# Patient Record
Sex: Male | Born: 1980 | Race: Black or African American | Hispanic: No | Marital: Single | Smoking: Former smoker
Health system: Southern US, Community
[De-identification: ages and names within clinical notes are randomized; demographics above are authoritative.]

## PROBLEM LIST (undated history)

## (undated) DIAGNOSIS — M109 Gout, unspecified: Secondary | ICD-10-CM

## (undated) DIAGNOSIS — I10 Essential (primary) hypertension: Secondary | ICD-10-CM

## (undated) HISTORY — DX: Morbid (severe) obesity due to excess calories: E66.01

## (undated) HISTORY — DX: Essential (primary) hypertension: I10

---

## 2012-03-22 ENCOUNTER — Encounter (HOSPITAL_COMMUNITY): Payer: Self-pay | Admitting: Radiology

## 2012-03-22 ENCOUNTER — Emergency Department (HOSPITAL_COMMUNITY)
Admission: EM | Admit: 2012-03-22 | Discharge: 2012-03-22 | Disposition: A | Discharge status: Home / Self Care | Payer: No Typology Code available for payment source | Attending: Emergency Medicine | Admitting: Emergency Medicine

## 2012-03-22 ENCOUNTER — Emergency Department (HOSPITAL_COMMUNITY): Payer: No Typology Code available for payment source

## 2012-03-22 DIAGNOSIS — F17200 Nicotine dependence, unspecified, uncomplicated: Secondary | ICD-10-CM | POA: Insufficient documentation

## 2012-03-22 DIAGNOSIS — S060X0A Concussion without loss of consciousness, initial encounter: Secondary | ICD-10-CM | POA: Insufficient documentation

## 2012-03-22 DIAGNOSIS — Y9389 Activity, other specified: Secondary | ICD-10-CM | POA: Insufficient documentation

## 2012-03-22 DIAGNOSIS — S0990XA Unspecified injury of head, initial encounter: Secondary | ICD-10-CM | POA: Insufficient documentation

## 2012-03-22 DIAGNOSIS — S060X9A Concussion with loss of consciousness of unspecified duration, initial encounter: Secondary | ICD-10-CM

## 2012-03-22 MED ORDER — HYDROCODONE-ACETAMINOPHEN 5-325 MG PO TABS
1.0000 | ORAL_TABLET | ORAL | Status: DC | PRN
Start: 2012-03-22 — End: 2012-06-25

## 2012-03-22 MED ORDER — HYDROCODONE-ACETAMINOPHEN 5-325 MG PO TABS: 1.0000 | ORAL_TABLET | Freq: Once | ORAL | Status: DC

## 2012-03-22 MED ORDER — IBUPROFEN 600 MG PO TABS: 600.0000 mg | ORAL_TABLET | Freq: Four times a day (QID) | ORAL | Status: DC | PRN

## 2012-03-22 MED ORDER — IBUPROFEN 400 MG PO TABS: 600.0000 mg | ORAL_TABLET | Freq: Once | ORAL | Status: DC
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
## 2012-03-22 NOTE — ED Notes (Signed)
Pt removed off backboard at this time
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
## 2012-03-22 NOTE — ED Notes (Signed)
Patient transported to CT 

## 2012-06-25 ENCOUNTER — Emergency Department (HOSPITAL_COMMUNITY)
Admission: EM | Admit: 2012-06-25 | Discharge: 2012-06-25 | Disposition: A | Discharge status: Home / Self Care | Payer: No Typology Code available for payment source | Attending: Emergency Medicine | Admitting: Emergency Medicine

## 2012-06-25 ENCOUNTER — Emergency Department (HOSPITAL_COMMUNITY): Payer: Self-pay

## 2012-06-25 ENCOUNTER — Encounter (HOSPITAL_COMMUNITY): Payer: Self-pay | Admitting: Emergency Medicine

## 2012-06-25 DIAGNOSIS — G8929 Other chronic pain: Secondary | ICD-10-CM | POA: Insufficient documentation

## 2012-06-25 DIAGNOSIS — F17200 Nicotine dependence, unspecified, uncomplicated: Secondary | ICD-10-CM | POA: Insufficient documentation

## 2012-06-25 DIAGNOSIS — M25579 Pain in unspecified ankle and joints of unspecified foot: Secondary | ICD-10-CM | POA: Insufficient documentation

## 2012-06-25 DIAGNOSIS — E669 Obesity, unspecified: Secondary | ICD-10-CM | POA: Insufficient documentation

## 2012-06-25 DIAGNOSIS — M19079 Primary osteoarthritis, unspecified ankle and foot: Secondary | ICD-10-CM | POA: Insufficient documentation

## 2012-06-25 MED ORDER — HYDROCODONE-ACETAMINOPHEN 5-325 MG PO TABS
2.0000 | ORAL_TABLET | Freq: Once | ORAL | Status: AC
  Administered 2012-06-25: 2 via ORAL
  Filled 2012-06-25: qty 2

## 2012-06-25 MED ORDER — HYDROCODONE-ACETAMINOPHEN 5-325 MG PO TABS: 1.0000 | ORAL_TABLET | ORAL | Status: DC | PRN

## 2012-06-25 MED ORDER — DICLOFENAC SODIUM 75 MG PO TBEC: 75.0000 mg | DELAYED_RELEASE_TABLET | Freq: Two times a day (BID) | ORAL | Status: AC

## 2012-06-25 MED ORDER — ONDANSETRON HCL 4 MG PO TABS
4.0000 mg | ORAL_TABLET | Freq: Once | ORAL | Status: AC
  Administered 2012-06-25: 4 mg via ORAL
  Filled 2012-06-25: qty 1

## 2012-06-25 MED ORDER — KETOROLAC TROMETHAMINE 10 MG PO TABS
10.0000 mg | ORAL_TABLET | Freq: Once | ORAL | Status: AC
  Administered 2012-06-25: 10 mg via ORAL
  Filled 2012-06-25: qty 1

## 2012-06-25 NOTE — ED Notes (Signed)
Pt c/o left foot/ankle pain and swelling x2 days. Pt states this is a chronic problem but "the swelling usually doesn't last this long".

## 2012-06-25 NOTE — ED Provider Notes (Signed)
History     CSN: [id number]  Arrival date & time 06/25/12  1131   First MD Initiated Contact with Patient 06/25/12 1145      Chief Complaint  Patient presents with  . Ankle Pain    (Consider location/radiation/quality/duration/timing/severity/associated sxs/prior treatment) Patient is a 32 y.o. male presenting with ankle pain. The history is provided by the patient.  Ankle Pain Location:  Ankle Time since incident:  3 days Injury: no   Ankle location:  L ankle Pain details:    Quality:  Aching   Radiates to:  Does not radiate   Severity:  Moderate   Onset quality:  Gradual   Timing:  Constant   Progression:  Worsening Chronicity:  Chronic Dislocation: no   Foreign body present:  No foreign bodies Prior injury to area:  Unable to specify Relieved by:  Nothing Worsened by:  Bearing weight Ineffective treatments:  Acetaminophen Associated symptoms: stiffness   Associated symptoms: no back pain, no neck pain, no numbness and no tingling   Risk factors: obesity   Risk factors: no frequent fractures and no recent illness     History reviewed. No pertinent past medical history.  History reviewed. No pertinent past surgical history.  History reviewed. No pertinent family history.  History  Substance Use Topics  . Smoking status: Current Every Day Smoker  . Smokeless tobacco: Not on file  . Alcohol Use: Yes      Review of Systems  Constitutional: Negative for activity change.       All ROS Neg except as noted in HPI  HENT: Negative for nosebleeds and neck pain.   Eyes: Negative for photophobia and discharge.  Respiratory: Negative for cough, shortness of breath and wheezing.   Cardiovascular: Negative for chest pain and palpitations.  Gastrointestinal: Negative for abdominal pain and blood in stool.  Genitourinary: Negative for dysuria, frequency and hematuria.  Musculoskeletal: Positive for stiffness. Negative for back pain and arthralgias.  Skin: Negative.    Neurological: Negative for dizziness, seizures and speech difficulty.  Psychiatric/Behavioral: Negative for hallucinations and confusion.    Allergies  Review of patient's allergies indicates no known allergies.  Home Medications  No current outpatient prescriptions on file.  BP 159/89  Pulse 76  Temp(Src) 98.7 F (37.1 C) (Oral)  Resp 20  Ht 5\' 8"  (1.727 m)  Wt 294 lb 7 oz (133.556 kg)  BMI 44.78 kg/m2  SpO2 100%  Physical Exam  Nursing note and vitals reviewed. Constitutional: He is oriented to person, place, and time. He appears well-developed and well-nourished.  Non-toxic appearance.  HENT:  Head: Normocephalic.  Right Ear: Tympanic membrane and external ear normal.  Left Ear: Tympanic membrane and external ear normal.  Eyes: EOM and lids are normal. Pupils are equal, round, and reactive to light.  Neck: Normal range of motion. Neck supple. Carotid bruit is not present.  Cardiovascular: Normal rate, regular rhythm, normal heart sounds, intact distal pulses and normal pulses.   Pulmonary/Chest: Breath sounds normal. No respiratory distress.  Abdominal: Soft. Bowel sounds are normal. There is no tenderness. There is no guarding.  Musculoskeletal: Normal range of motion.  Lower lateral malleolus pain to palpation and with flexion and extension of the ankle. No hot areas noted. No effusion of the joint noted. The Achilles tendon is intact. The dorsalis pedis pulses 2+. The capillary refill is less than 3 seconds.  Lymphadenopathy:       Head (right side): No submandibular adenopathy present.  Head (left side): No submandibular adenopathy present.    He has no cervical adenopathy.  Neurological: He is alert and oriented to person, place, and time. He has normal strength. No cranial nerve deficit or sensory deficit.  Skin: Skin is warm and dry.  Psychiatric: He has a normal mood and affect. His speech is normal.    ED Course  Procedures (including critical care  time) Pulse oximetry 100% on room air. Within normal limits by my interpretation. Labs Reviewed - No data to display Dg Ankle Complete Left  06/25/2012  *RADIOLOGY REPORT*  Clinical Data: Ankle pain  LEFT ANKLE COMPLETE - 3+ VIEW  Comparison: None.  Findings: Three views of the left ankle submitted.  No acute fracture or subluxation.  Mild dorsal spurring noted talonavicular joint.  Ankle mortise is preserved.  IMPRESSION: No acute fracture or subluxation.  Mild degenerative changes.   Original Report Authenticated By: Natasha Mead, M.D.      No diagnosis found.    MDM  I have reviewed nursing notes, vital signs, and all appropriate lab and imaging results for this patient. X-ray of the left ankle reveals mild dorsal spurring noted at the talonavicular joint. Suspect that the ankle pain is related to degenerative changes. Patient is fitted with crutches. Is given a prescription for Norco and diclofenac. Patient is to see Dr. Romeo Apple if not improving.       Kathie Dike, [place] 06/25/12 1426

## 2012-06-25 NOTE — ED Notes (Signed)
States his left ankle is swollen, no known injury.  States he has had problems with his ankles since he was a child.  States that his episode of swelling started about 3 days ago.  Pain with walking.

## 2012-06-27 NOTE — ED Provider Notes (Signed)
Medical screening examination/treatment/procedure(s) were performed by non-physician practitioner and as supervising physician I was immediately available for consultation/collaboration.  Donnetta Hutching, MD 06/27/12 [phone]

## 2013-12-19 ENCOUNTER — Encounter (HOSPITAL_COMMUNITY): Payer: Self-pay | Admitting: Emergency Medicine

## 2013-12-19 ENCOUNTER — Emergency Department (HOSPITAL_COMMUNITY): Payer: Medicare Other

## 2013-12-19 ENCOUNTER — Emergency Department (HOSPITAL_COMMUNITY)
Admission: EM | Admit: 2013-12-19 | Discharge: 2013-12-19 | Disposition: A | Discharge status: Home / Self Care | Payer: Medicare Other | Attending: Emergency Medicine | Admitting: Emergency Medicine

## 2013-12-19 DIAGNOSIS — S8990XA Unspecified injury of unspecified lower leg, initial encounter: Secondary | ICD-10-CM | POA: Diagnosis present

## 2013-12-19 DIAGNOSIS — Y9289 Other specified places as the place of occurrence of the external cause: Secondary | ICD-10-CM | POA: Insufficient documentation

## 2013-12-19 DIAGNOSIS — S93409A Sprain of unspecified ligament of unspecified ankle, initial encounter: Secondary | ICD-10-CM | POA: Insufficient documentation

## 2013-12-19 DIAGNOSIS — S93402A Sprain of unspecified ligament of left ankle, initial encounter: Secondary | ICD-10-CM

## 2013-12-19 DIAGNOSIS — M25579 Pain in unspecified ankle and joints of unspecified foot: Secondary | ICD-10-CM | POA: Insufficient documentation

## 2013-12-19 DIAGNOSIS — Z87891 Personal history of nicotine dependence: Secondary | ICD-10-CM | POA: Insufficient documentation

## 2013-12-19 DIAGNOSIS — Y9389 Activity, other specified: Secondary | ICD-10-CM | POA: Diagnosis not present

## 2013-12-19 DIAGNOSIS — X500XXA Overexertion from strenuous movement or load, initial encounter: Secondary | ICD-10-CM | POA: Diagnosis not present

## 2013-12-19 DIAGNOSIS — S99919A Unspecified injury of unspecified ankle, initial encounter: Secondary | ICD-10-CM | POA: Diagnosis present

## 2013-12-19 MED ORDER — HYDROCODONE-ACETAMINOPHEN 5-325 MG PO TABS
1.0000 | ORAL_TABLET | Freq: Once | ORAL | Status: AC
  Administered 2013-12-19: 1 via ORAL
  Filled 2013-12-19: qty 1

## 2013-12-19 MED ORDER — IBUPROFEN 600 MG PO TABS: 600.0000 mg | ORAL_TABLET | Freq: Three times a day (TID) | ORAL | Status: DC

## 2013-12-19 MED ORDER — HYDROCODONE-ACETAMINOPHEN 5-325 MG PO TABS: 1.0000 | ORAL_TABLET | ORAL | Status: DC | PRN

## 2013-12-19 NOTE — Discharge Instructions (Signed)
Ankle Sprain °An ankle sprain is an injury to the strong, fibrous tissues (ligaments) that hold the bones of your ankle joint together.  °CAUSES °An ankle sprain is usually caused by a fall or by twisting your ankle. Ankle sprains most commonly occur when you step on the outer edge of your foot, and your ankle turns inward. People who participate in sports are more prone to these types of injuries.  °SYMPTOMS  °· Pain in your ankle. The pain may be present at rest or only when you are trying to stand or walk. °· Swelling. °· Bruising. Bruising may develop immediately or within 1 to 2 days after your injury. °· Difficulty standing or walking, particularly when turning corners or changing directions. °DIAGNOSIS  °Your caregiver will ask you details about your injury and perform a physical exam of your ankle to determine if you have an ankle sprain. During the physical exam, your caregiver will press on and apply pressure to specific areas of your foot and ankle. Your caregiver will try to move your ankle in certain ways. An X-ray exam may be done to be sure a bone was not broken or a ligament did not separate from one of the bones in your ankle (avulsion fracture).  °TREATMENT  °Certain types of braces can help stabilize your ankle. Your caregiver can make a recommendation for this. Your caregiver may recommend the use of medicine for pain. If your sprain is severe, your caregiver may refer you to a surgeon who helps to restore function to parts of your skeletal system (orthopedist) or a physical therapist. °HOME CARE INSTRUCTIONS  °· Apply ice to your injury for 1-2 days or as directed by your caregiver. Applying ice helps to reduce inflammation and pain. °¨ Put ice in a plastic bag. °¨ Place a towel between your skin and the bag. °¨ Leave the ice on for 15-20 minutes at a time, every 2 hours while you are awake. °· Only take over-the-counter or prescription medicines for pain, discomfort, or fever as directed by  your caregiver. °· Elevate your injured ankle above the level of your heart as much as possible for 2-3 days. °· If your caregiver recommends crutches, use them as instructed. Gradually put weight on the affected ankle. Continue to use crutches or a cane until you can walk without feeling pain in your ankle. °· If you have a plaster splint, wear the splint as directed by your caregiver. Do not rest it on anything harder than a pillow for the first 24 hours. Do not put weight on it. Do not get it wet. You may take it off to take a shower or bath. °· You may have been given an elastic bandage to wear around your ankle to provide support. If the elastic bandage is too tight (you have numbness or tingling in your foot or your foot becomes cold and blue), adjust the bandage to make it comfortable. °· If you have an air splint, you may blow more air into it or let air out to make it more comfortable. You may take your splint off at night and before taking a shower or bath. Wiggle your toes in the splint several times per day to decrease swelling. °SEEK MEDICAL CARE IF:  °· You have rapidly increasing bruising or swelling. °· Your toes feel extremely cold or you lose feeling in your foot. °· Your pain is not relieved with medicine. °SEEK IMMEDIATE MEDICAL CARE IF: °· Your toes are numb or blue. °·   You have severe pain that is increasing. MAKE SURE YOU:   Understand these instructions.  Will watch your condition.  Will get help right away if you are not doing well or get worse. Document Released: 04/19/2005 Document Revised: 01/12/2012 Document Reviewed: 05/01/2011 [facility] Patient Information 2015 [place], [place]. This information is not intended to replace advice given to you by your health care provider. Make sure you discuss any questions you have with your health care provider.   Wear the ASO ankle brace and use crutches to avoid weight bearing.  Use ice and elevation as much as possible for the next  several days to help reduce the swelling.  Take the medications prescribed.  You may take the hydrocodone prescribed for pain relief.  This will make you drowsy - do not drive within 4 hours of taking this medication.  Use the ibuprofen also for inflammation.  Call the orthopedic doctor listed for a recheck of your injury in 1 week if not improved.  You may benefit from physical therapy of your ankle if it is not getting better over the next week.

## 2013-12-19 NOTE — ED Notes (Signed)
Pt reports twisting left ankle going down steps. Pt ambulating without difficulty, no swelling noted.

## 2013-12-21 NOTE — ED Provider Notes (Signed)
CSN: [id number]     Arrival date & time 12/19/13  2038 History   First MD Initiated Contact with Patient 12/19/13 2151     Chief Complaint  Patient presents with  . Ankle Pain     (Consider location/radiation/quality/duration/timing/severity/associated sxs/prior Treatment) The history is provided by the patient.   Leonard Rivera is a 33 y.o. male prsenting with left ankle pain which occurred suddenly when he inverted the ankle while going down a flight of steps.  Pain is aching, constant and worse with palpation, movement and weight bearing.  The patient was able to weight bear immediately after the event.  There is Rivera radiation of pain and the patient denies numbness distal to the injury site.  He has taken Rivera medicines or tx prior to arrival for this injury.     History reviewed. Rivera pertinent past medical history. History reviewed. Rivera pertinent past surgical history. History reviewed. Rivera pertinent family history. History  Substance Use Topics  . Smoking status: Former Games developer  . Smokeless tobacco: Not on file  . Alcohol Use: Yes    Review of Systems  Musculoskeletal: Positive for arthralgias and joint swelling.  Skin: Negative for wound.  Neurological: Negative for weakness and numbness.      Allergies  Review of patient's allergies indicates Rivera known allergies.  Home Medications   Prior to Admission medications   Medication Sig Start Date End Date Taking? Authorizing Provider  HYDROcodone-acetaminophen (NORCO/VICODIN) 5-325 MG per tablet Take 1 tablet by mouth every 4 (four) hours as needed for pain. 06/25/12   Kathie Dike, PA-C  HYDROcodone-acetaminophen (NORCO/VICODIN) 5-325 MG per tablet Take 1 tablet by mouth every 4 (four) hours as needed. 12/19/13   Burgess Amor, PA-C  ibuprofen (ADVIL,MOTRIN) 600 MG tablet Take 1 tablet (600 mg total) by mouth 3 (three) times daily. 12/19/13   Burgess Amor, PA-C   BP 139/88  Pulse 91  Temp(Src) 98.3 F (36.8 C)  Resp 22  Ht 5'  8" (1.727 m)  Wt 300 lb (136.079 kg)  BMI 45.63 kg/m2  SpO2 98% Physical Exam  Nursing note and vitals reviewed. Constitutional: He appears well-developed and well-nourished.  HENT:  Head: Normocephalic.  Cardiovascular: Normal rate and intact distal pulses.  Exam reveals Rivera decreased pulses.   Pulses:      Dorsalis pedis pulses are 2+ on the right side, and 2+ on the left side.       Posterior tibial pulses are 2+ on the right side, and 2+ on the left side.  Musculoskeletal: He exhibits tenderness. He exhibits Rivera edema.       Left ankle: He exhibits decreased range of motion. He exhibits Rivera swelling, Rivera ecchymosis, Rivera deformity and normal pulse. Tenderness. Lateral malleolus tenderness found. Rivera head of 5th metatarsal and Rivera proximal fibula tenderness found. Achilles tendon normal.  Neurological: He is alert. Rivera sensory deficit.  Skin: Skin is warm, dry and intact.    ED Course  Procedures (including critical care time) Labs Review Labs Reviewed - Rivera data to display  Imaging Review Rivera results found.   EKG Interpretation None      MDM   Final diagnoses:  Ankle sprain, left, initial encounter    Patients labs and/or radiological studies were viewed and considered during the medical decision making and disposition process. Pt with ankle sprain.  Rivera proximal fibular tenderness.  ASO, crutches, RICE, ibuprofen, hydrocodone prescribed.  Pt encouraged f/u with ortho in 7-10 days if sx not improved.  Referral given.     Burgess AmorJulie Shephanie Romas, PA-C 12/21/13 2259

## 2013-12-26 NOTE — ED Provider Notes (Signed)
Medical screening examination/treatment/procedure(s) were performed by non-physician practitioner and as supervising physician I was immediately available for consultation/collaboration.   EKG Interpretation None        Vanetta Mulders, MD 12/26/13 1545

## 2014-10-03 ENCOUNTER — Emergency Department (HOSPITAL_COMMUNITY): Payer: Medicare Other

## 2014-10-03 ENCOUNTER — Emergency Department (HOSPITAL_COMMUNITY)
Admission: EM | Admit: 2014-10-03 | Discharge: 2014-10-03 | Disposition: A | Discharge status: Home / Self Care | Payer: Medicare Other | Attending: Emergency Medicine | Admitting: Emergency Medicine

## 2014-10-03 ENCOUNTER — Encounter (HOSPITAL_COMMUNITY): Payer: Self-pay

## 2014-10-03 DIAGNOSIS — Z720 Tobacco use: Secondary | ICD-10-CM | POA: Diagnosis not present

## 2014-10-03 DIAGNOSIS — M19071 Primary osteoarthritis, right ankle and foot: Secondary | ICD-10-CM | POA: Insufficient documentation

## 2014-10-03 DIAGNOSIS — Z791 Long term (current) use of non-steroidal anti-inflammatories (NSAID): Secondary | ICD-10-CM | POA: Insufficient documentation

## 2014-10-03 DIAGNOSIS — Z79899 Other long term (current) drug therapy: Secondary | ICD-10-CM | POA: Insufficient documentation

## 2014-10-03 DIAGNOSIS — M79671 Pain in right foot: Secondary | ICD-10-CM | POA: Diagnosis present

## 2014-10-03 MED ORDER — DICLOFENAC SODIUM 75 MG PO TBEC: 75.0000 mg | DELAYED_RELEASE_TABLET | Freq: Two times a day (BID) | ORAL | Status: DC

## 2014-10-03 NOTE — Discharge Instructions (Signed)
Arthritis, Nonspecific °Arthritis is pain, redness, warmth, or puffiness (inflammation) of a joint. The joint may be stiff or hurt when you move it. One or more joints may be affected. There are many types of arthritis. Your doctor may not know what type you have right away. The most common cause of arthritis is wear and tear on the joint (osteoarthritis). °HOME CARE  °· Only take medicine as told by your doctor. °· Rest the joint as much as possible. °· Raise (elevate) your joint if it is puffy. °· Use crutches if the painful joint is in your leg. °· Drink enough fluids to keep your pee (urine) clear or pale yellow. °· Follow your doctor's diet instructions. °· Use cold packs for very bad joint pain for 10 to 15 minutes every hour. Ask your doctor if it is okay for you to use hot packs. °· Exercise as told by your doctor. °· Take a warm shower if you have stiffness in the morning. °· Move your sore joints throughout the day. °GET HELP RIGHT AWAY IF:  °· You have a fever. °· You have very bad joint pain, puffiness, or redness. °· You have many joints that are painful and puffy. °· You are not getting better with treatment. °· You have very bad back pain or leg weakness. °· You cannot control when you poop (bowel movement) or pee (urinate). °· You do not feel better in 24 hours or are getting worse. °· You are having side effects from your medicine. °MAKE SURE YOU:  °· Understand these instructions. °· Will watch your condition. °· Will get help right away if you are not doing well or get worse. °Document Released: 07/14/2009 Document Revised: 10/19/2011 Document Reviewed: 07/14/2009 °ExitCare® Patient Information ©2015 [employer]. This information is not intended to replace advice given to you by your health care provider. Make sure you discuss any questions you have with your health care provider. ° °

## 2014-10-03 NOTE — ED Notes (Signed)
Rt foot and ankle pain since Tuesday, No known injury.  Hx of DJD.

## 2014-10-03 NOTE — ED Notes (Signed)
Pt c/o pain and swelling to inner part of r foot and ankle since Tuesday.  Denies injury.

## 2014-10-05 NOTE — ED Provider Notes (Signed)
CSN: [id number]     Arrival date & time 10/03/14  1059 History   First MD Initiated Contact with Patient 10/03/14 1142     Chief Complaint  Patient presents with  . Foot Pain     (Consider location/radiation/quality/duration/timing/severity/associated sxs/prior Treatment) HPI   Leonard Rivera is a 34 y.o. male who presents to the Emergency Department complaining of right ankle pain for 3 days.  Reports intermittent pain to his ankle without known injury.  Pain is worse with movement, improves with rest.  He denies redness, fever, recent illness or pain proximal to the ankle.  He has not taken any medications for his symptoms   History reviewed. No pertinent past medical history. History reviewed. No pertinent past surgical history. No family history on file. History  Substance Use Topics  . Smoking status: Current Every Day Smoker  . Smokeless tobacco: Not on file  . Alcohol Use: Yes     Comment: occ    Review of Systems  Constitutional: Negative for fever and chills.  Genitourinary: Negative for dysuria and difficulty urinating.  Musculoskeletal: Positive for joint swelling and arthralgias (right ankle pain).  Skin: Negative for color change and wound.  All other systems reviewed and are negative.     Allergies  Review of patient's allergies indicates no known allergies.  Home Medications   Prior to Admission medications   Medication Sig Start Date End Date Taking? Authorizing Provider  diclofenac (VOLTAREN) 75 MG EC tablet Take 1 tablet (75 mg total) by mouth 2 (two) times daily. Take with food 10/03/14   Asahel Risden, PA-C  HYDROcodone-acetaminophen (NORCO/VICODIN) 5-325 MG per tablet Take 1 tablet by mouth every 4 (four) hours as needed for pain. 06/25/12   Ivery QualeHobson Bryant, PA-C  HYDROcodone-acetaminophen (NORCO/VICODIN) 5-325 MG per tablet Take 1 tablet by mouth every 4 (four) hours as needed. 12/19/13   Burgess AmorJulie Idol, PA-C  ibuprofen (ADVIL,MOTRIN) 600 MG tablet Take 1  tablet (600 mg total) by mouth 3 (three) times daily. 12/19/13   Burgess AmorJulie Idol, PA-C   BP 135/83 mmHg  Pulse 79  Temp(Src) 99.3 F (37.4 C) (Oral)  Resp 18  Ht 5\' 8"  (1.727 m)  Wt 300 lb (136.079 kg)  BMI 45.63 kg/m2  SpO2 96% Physical Exam  Constitutional: He is oriented to person, place, and time. He appears well-developed and well-nourished. No distress.  HENT:  Head: Normocephalic and atraumatic.  Cardiovascular: Normal rate, regular rhythm, normal heart sounds and intact distal pulses.   Pulmonary/Chest: Effort normal and breath sounds normal. No respiratory distress.  Musculoskeletal: He exhibits tenderness. He exhibits no edema.  Diffuse ttp of the right ankle.   DP pulse is brisk,distal sensation intact.  No erythema, abrasion, bruising or bony deformity.  No proximal tenderness.  Neurological: He is alert and oriented to person, place, and time. He exhibits normal muscle tone. Coordination normal.  Skin: Skin is warm and dry.  Nursing note and vitals reviewed.   ED Course  Procedures (including critical care time) Labs Review Labs Reviewed - No data to display  Imaging Review Dg Ankle Complete Right  10/03/2014   CLINICAL DATA:  Pain and swelling medially.  No known injury  EXAM: RIGHT ANKLE - COMPLETE 3+ VIEW  COMPARISON:  None.  FINDINGS: Frontal, oblique, and lateral views obtained. There is mild generalized soft tissue swelling. There is no demonstrable fracture or joint effusion. There are benign-appearing exostoses arising from the dorsal distal talus and proximal navicular with arthropathy in this area. There  is a small posterior calcaneal spur. There is mild spurring in the medial malleolar region as well.  IMPRESSION: Areas of osteoarthritic change. Benign-appearing exostoses arising from the distal talus and proximal navicular. No acute fracture. Mortise intact.   Electronically Signed   By: Bretta Bang III M.D.   On: 10/03/2014 11:51   Dg Foot Complete  Right  10/03/2014   CLINICAL DATA:  Right foot pain and swelling, no known injury, initial encounter  EXAM: RIGHT FOOT COMPLETE - 3+ VIEW  COMPARISON:  None.  FINDINGS: Mild degenerative changes are noted the first MTP joint. No acute fracture or dislocation is seen. Degenerative changes in the tarsal bones are seen. No gross soft tissue abnormality is noted.  IMPRESSION: No acute abnormality seen.   Electronically Signed   By: Alcide Clever M.D.   On: 10/03/2014 11:53  '   EKG Interpretation None      MDM   Final diagnoses:  Osteoarthritis of right ankle, unspecified osteoarthritis type    Pt is well appearing.  No concerning sx's for septic joint.  Ortho referral given.     ASO applied for comfort.  Remains NV intact  Pauline Aus, PA-C 10/05/14 2234  Benjiman Core, MD 10/07/14 [phone]

## 2016-01-01 ENCOUNTER — Encounter (HOSPITAL_COMMUNITY): Payer: Self-pay | Admitting: Cardiology

## 2016-01-01 ENCOUNTER — Emergency Department (HOSPITAL_COMMUNITY)
Admission: EM | Admit: 2016-01-01 | Discharge: 2016-01-01 | Disposition: A | Discharge status: Home / Self Care | Payer: Medicare Other | Attending: Emergency Medicine | Admitting: Emergency Medicine

## 2016-01-01 ENCOUNTER — Emergency Department (HOSPITAL_COMMUNITY): Payer: Medicare Other

## 2016-01-01 DIAGNOSIS — L02512 Cutaneous abscess of left hand: Secondary | ICD-10-CM | POA: Insufficient documentation

## 2016-01-01 DIAGNOSIS — Z23 Encounter for immunization: Secondary | ICD-10-CM | POA: Insufficient documentation

## 2016-01-01 DIAGNOSIS — M79642 Pain in left hand: Secondary | ICD-10-CM | POA: Diagnosis present

## 2016-01-01 DIAGNOSIS — F17200 Nicotine dependence, unspecified, uncomplicated: Secondary | ICD-10-CM | POA: Insufficient documentation

## 2016-01-01 MED ORDER — SULFAMETHOXAZOLE-TRIMETHOPRIM 800-160 MG PO TABS: 1.0000 | ORAL_TABLET | Freq: Two times a day (BID) | ORAL | 0 refills | Status: AC

## 2016-01-01 MED ORDER — TETANUS-DIPHTH-ACELL PERTUSSIS 5-2.5-18.5 LF-MCG/0.5 IM SUSP
0.5000 mL | Freq: Once | INTRAMUSCULAR | Status: AC
  Administered 2016-01-01: 0.5 mL via INTRAMUSCULAR
  Filled 2016-01-01: qty 0.5

## 2016-01-01 MED ORDER — HYDROCODONE-ACETAMINOPHEN 5-325 MG PO TABS: 1.0000 | ORAL_TABLET | Freq: Four times a day (QID) | ORAL | 0 refills | Status: DC | PRN

## 2016-01-01 MED ORDER — LIDOCAINE HCL (PF) 2 % IJ SOLN
INTRAMUSCULAR | Status: AC
  Administered 2016-01-01: 12:00:00
  Filled 2016-01-01: qty 10

## 2016-01-01 MED ORDER — IBUPROFEN 600 MG PO TABS: 600.0000 mg | ORAL_TABLET | Freq: Four times a day (QID) | ORAL | 0 refills | Status: DC | PRN

## 2016-01-01 NOTE — Discharge Instructions (Signed)
As discussed complete the entire course of the anabolic prescribed.  It is important for you to do a warm Epsom salt soak 3 times daily for 10 minutes each soak along with gentle massage to help keep this abscess site opening draining.

## 2016-01-01 NOTE — ED Triage Notes (Signed)
Left wrist swelling and pain since this weekend. Denies any injury

## 2016-01-03 LAB — AEROBIC CULTURE W GRAM STAIN (SUPERFICIAL SPECIMEN)

## 2016-01-03 LAB — AEROBIC CULTURE  (SUPERFICIAL SPECIMEN)

## 2016-01-03 NOTE — ED Provider Notes (Signed)
[facility] Provider Note   CSN: [id number] Arrival date & time: 01/01/16  4098     History   Chief Complaint Chief Complaint  Patient presents with  . Joint Swelling    HPI Leonard Rivera is a 35 y.o. male presenting with pain, swelling and redness of his left hand.  He reports waking Saturday morning (5 days ago) with a small papule on his left dorsal hand which has become larger and more tender.  He denies fevers, chills and radiation of pain and there has been no drainage from the site.  He denies any injury and is not aware of any insect bites.  He has had no medicines or treatment prior to arrival today.  HPI  History reviewed. No pertinent past medical history.  There are no active problems to display for this patient.   History reviewed. No pertinent surgical history.     Home Medications    Prior to Admission medications   Medication Sig Start Date End Date Taking? Authorizing Provider  HYDROcodone-acetaminophen (NORCO/VICODIN) 5-325 MG tablet Take 1 tablet by mouth every 6 (six) hours as needed for severe pain. 01/01/16   Burgess Amor, PA-C  ibuprofen (ADVIL,MOTRIN) 600 MG tablet Take 1 tablet (600 mg total) by mouth every 6 (six) hours as needed for moderate pain. 01/01/16   Burgess Amor, PA-C  sulfamethoxazole-trimethoprim (BACTRIM DS,SEPTRA DS) 800-160 MG tablet Take 1 tablet by mouth 2 (two) times daily. 01/01/16 01/08/16  Burgess Amor, PA-C    Family History History reviewed. No pertinent family history.  Social History Social History  Substance Use Topics  . Smoking status: Current Every Day Smoker  . Smokeless tobacco: Never Used  . Alcohol use Yes     Comment: occ     Allergies   Review of patient's allergies indicates no known allergies.   Review of Systems Review of Systems  Constitutional: Negative for chills and fever.  Respiratory: Negative for shortness of breath and wheezing.   Skin: Positive for color change and wound.  Neurological:  Negative for weakness and numbness.     Physical Exam Updated Vital Signs BP (!) 164/102 (BP Location: Left Arm) Comment: Pt tightening arm, would not relax   Pulse 72   Temp 98.3 F (36.8 C) (Oral)   Resp 16   Ht 5\' 8"  (1.727 m)   Wt 136.1 kg   SpO2 99%   BMI 45.61 kg/m   Physical Exam  Constitutional: He is oriented to person, place, and time. He appears well-developed and well-nourished.  HENT:  Head: Normocephalic.  Cardiovascular: Normal rate.   Pulmonary/Chest: Effort normal.  Musculoskeletal: He exhibits tenderness.       Hands: Raised erythematous lesion left dorsal hand,  Warm and erythematous with central tenting without drainage.  Slightly fluctuant.  Pt can flex/ext fingers without discomfort.  Neurological: He is alert and oriented to person, place, and time. No sensory deficit.  Skin: Laceration noted.     ED Treatments / Results  Labs (all labs ordered are listed, but only abnormal results are displayed) Labs Reviewed  AEROBIC CULTURE (SUPERFICIAL SPECIMEN)    EKG  EKG Interpretation None       Radiology Dg Hand Complete Left  Result Date: 01/01/2016 CLINICAL DATA:  Approximate 4 day history of diffuse left hand pain and swelling. No known injuries. EXAM: LEFT HAND - COMPLETE 3+ VIEW COMPARISON:  Left hand x-rays 12/29/2015 from [facility]. FINDINGS: Diffuse soft tissue swelling involving the hand and fingers.  Old healed fracture involving the fifth metacarpal. No evidence of acute or subacute fracture or dislocation. Well preserved joint spaces. Well-preserved bone mineral density. IMPRESSION: No acute osseous abnormality. Old healed fracture involving the fifth metacarpal. Diffuse soft tissue swelling. Electronically Signed   By: Hulan Saashomas  Lawrence M.D.   On: 01/01/2016 10:28    Procedures Procedures (including critical care time)   INCISION AND DRAINAGE Performed by: Burgess AmorIDOL, Jaxon Mynhier Consent: Verbal consent obtained. Risks and  benefits: risks, benefits and alternatives were discussed Type: abscess  Body area: left hand  Anesthesia: local infiltration  Incision was made with a scalpel.  Local anesthetic: lidocaine 2% without epinephrine  Anesthetic total: 3 ml  Complexity: complex Blunt dissection to break up loculations  Drainage: purulent  Drainage amount: moderate  Packing material: none Patient tolerance: Patient tolerated the procedure well with no immediate complications.     Medications Ordered in ED Medications  Tdap (BOOSTRIX) injection 0.5 mL (0.5 mLs Intramuscular Given 01/01/16 1048)  lidocaine (XYLOCAINE) 2 % injection (  Given by Other 01/01/16 1225)     Initial Impression / Assessment and Plan / ED Course  I have reviewed the triage vital signs and the nursing notes.  Pertinent labs & imaging results that were available during my care of the patient were reviewed by me and considered in my medical decision making (see chart for details).  Clinical Course    Warm epsom salt soaks, bactrim, hydrocodone, recheck here if not improving or for any worsening sx beyond the next 48 hours.  Pt was seen by Dr. Denton LankSteinl during this visit.  Final Clinical Impressions(s) / ED Diagnoses   Final diagnoses:  Abscess of hand, left    New Prescriptions Discharge Medication List as of 01/01/2016 11:56 AM    START taking these medications   Details  HYDROcodone-acetaminophen (NORCO/VICODIN) 5-325 MG tablet Take 1 tablet by mouth every 6 (six) hours as needed for severe pain., Starting Thu 01/01/2016, Print    ibuprofen (ADVIL,MOTRIN) 600 MG tablet Take 1 tablet (600 mg total) by mouth every 6 (six) hours as needed for moderate pain., Starting Thu 01/01/2016, Print    sulfamethoxazole-trimethoprim (BACTRIM DS,SEPTRA DS) 800-160 MG tablet Take 1 tablet by mouth 2 (two) times daily., Starting Thu 01/01/2016, Until Thu 01/08/2016, Print         Burgess AmorJulie Add Dinapoli, PA-C 01/03/16 [id number]    Cathren LaineKevin Steinl,  MD 01/06/16 [phone]

## 2016-05-21 ENCOUNTER — Ambulatory Visit: Payer: Self-pay | Admitting: Family Medicine

## 2016-05-25 ENCOUNTER — Ambulatory Visit (INDEPENDENT_AMBULATORY_CARE_PROVIDER_SITE_OTHER): Payer: Medicaid Other | Admitting: Family Medicine

## 2016-05-25 ENCOUNTER — Encounter: Payer: Self-pay | Admitting: Family Medicine

## 2016-05-25 VITALS — BP 134/84 | HR 79 | Temp 97.8°F | Ht 68.0 in | Wt 302.0 lb

## 2016-05-25 DIAGNOSIS — Z0000 Encounter for general adult medical examination without abnormal findings: Secondary | ICD-10-CM

## 2016-05-25 DIAGNOSIS — Z720 Tobacco use: Secondary | ICD-10-CM | POA: Insufficient documentation

## 2016-05-25 NOTE — Patient Instructions (Addendum)
Great to meet you!  Come back in 6 months unless you need us sooner. If there are any concerns on your labs we will ask you to come back sooner.

## 2016-05-25 NOTE — Progress Notes (Signed)
   HPI  Patient presents today here to establish care with no complaints.  Patient states that he is not happy with his weight, he is recently started going to the gym one hour a day doing mostly cardio 5 days a week.  He's watching his diet more carefully and has cut out sodas. He denies eating any fast food and states that he lives with his grandmother who feeds him very well.  He states that he has been on disability since he was for 36 years old and can't describe why.  He is a smoker and has tried to quit a few times, he is not very interested in quitting today. He does not go to any other doctors and has not been diagnosed with any mental illness.  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  # Annual physical exam Patient mute to her clinic, no complaints today Discussed morbid obesity in detail, he's made recent positive lifestyle changes, I congratulated him on that Basic labs including lipid panel and thyroid Follow-up every 6 months unless needed sooner, primarily to discuss therapeutic lifestyle changes for morbid obesity Patient is on disability and cannot describe why, I'm suspicious of cognitive impairment  # Morbid obesity Congratulated on recent changes, discussed therapeutic lifestyle changes  #  Tobacco abuse Patient does not want any pharmacologic intervention today, offered our assistance Recommended cessation    Orders Placed This Encounter  Procedures  . Lipid panel  . CMP14+EGFR  . CBC with Differential/Platelet  . TSH     Laroy Apple, MD [facility] 05/25/2016, 9:40 AM

## 2016-05-26 LAB — CBC WITH DIFFERENTIAL/PLATELET
BASOS ABS: 0.1 10*3/uL (ref 0.0–0.2)
Basos: 1 %
EOS (ABSOLUTE): 0.4 10*3/uL (ref 0.0–0.4)
Eos: 5 %
Hematocrit: 45.6 % (ref 37.5–51.0)
Hemoglobin: 15.5 g/dL (ref 13.0–17.7)
IMMATURE GRANS (ABS): 0 10*3/uL (ref 0.0–0.1)
IMMATURE GRANULOCYTES: 0 %
LYMPHS: 30 %
Lymphocytes Absolute: 2.6 10*3/uL (ref 0.7–3.1)
MCH: 29.7 pg (ref 26.6–33.0)
MCHC: 34 g/dL (ref 31.5–35.7)
MCV: 87 fL (ref 79–97)
Monocytes Absolute: 0.7 10*3/uL (ref 0.1–0.9)
Monocytes: 8 %
NEUTROS PCT: 56 %
Neutrophils Absolute: 4.8 10*3/uL (ref 1.4–7.0)
PLATELETS: 291 10*3/uL (ref 150–379)
RBC: 5.22 x10E6/uL (ref 4.14–5.80)
RDW: 13.8 % (ref 12.3–15.4)
WBC: 8.5 10*3/uL (ref 3.4–10.8)

## 2016-05-26 LAB — LIPID PANEL
Chol/HDL Ratio: 4.4 ratio units (ref 0.0–5.0)
Cholesterol, Total: 190 mg/dL (ref 100–199)
HDL: 43 mg/dL (ref >39)
LDL Calculated: 126 mg/dL — ABNORMAL HIGH (ref 0–99)
Triglycerides: 105 mg/dL (ref 0–149)
VLDL Cholesterol Cal: 21 mg/dL (ref 5–40)

## 2016-05-26 LAB — CMP14+EGFR
A/G RATIO: 2 (ref 1.2–2.2)
ALT: 36 IU/L (ref 0–44)
AST: 22 IU/L (ref 0–40)
Albumin: 4.3 g/dL (ref 3.5–5.5)
Alkaline Phosphatase: 93 IU/L (ref 39–117)
BILIRUBIN TOTAL: 0.4 mg/dL (ref 0.0–1.2)
BUN/Creatinine Ratio: 14 (ref 9–20)
BUN: 12 mg/dL (ref 6–20)
CALCIUM: 9.2 mg/dL (ref 8.7–10.2)
CHLORIDE: 101 mmol/L (ref 96–106)
CO2: 23 mmol/L (ref 18–29)
Creatinine, Ser: 0.85 mg/dL (ref 0.76–1.27)
GFR calc Af Amer: 131 mL/min/{1.73_m2} (ref >59)
GFR, EST NON AFRICAN AMERICAN: 113 mL/min/{1.73_m2} (ref >59)
Globulin, Total: 2.2 g/dL (ref 1.5–4.5)
Glucose: 97 mg/dL (ref 65–99)
POTASSIUM: 4.5 mmol/L (ref 3.5–5.2)
Sodium: 142 mmol/L (ref 134–144)
Total Protein: 6.5 g/dL (ref 6.0–8.5)

## 2016-05-26 LAB — TSH: TSH: 1.46 u[IU]/mL (ref 0.450–4.500)

## 2016-05-31 ENCOUNTER — Telehealth: Payer: Self-pay | Admitting: Family Medicine

## 2016-05-31 NOTE — Telephone Encounter (Signed)
Patient aware of results.

## 2016-12-20 ENCOUNTER — Encounter (HOSPITAL_COMMUNITY): Payer: Self-pay | Admitting: Emergency Medicine

## 2016-12-20 ENCOUNTER — Emergency Department (HOSPITAL_COMMUNITY): Payer: Medicare Other

## 2016-12-20 ENCOUNTER — Emergency Department (HOSPITAL_COMMUNITY)
Admission: EM | Admit: 2016-12-20 | Discharge: 2016-12-20 | Disposition: A | Discharge status: Home / Self Care | Payer: Medicare Other | Attending: Emergency Medicine | Admitting: Emergency Medicine

## 2016-12-20 DIAGNOSIS — M7989 Other specified soft tissue disorders: Secondary | ICD-10-CM | POA: Diagnosis not present

## 2016-12-20 DIAGNOSIS — F17200 Nicotine dependence, unspecified, uncomplicated: Secondary | ICD-10-CM | POA: Diagnosis not present

## 2016-12-20 DIAGNOSIS — M10072 Idiopathic gout, left ankle and foot: Secondary | ICD-10-CM | POA: Diagnosis not present

## 2016-12-20 DIAGNOSIS — R2242 Localized swelling, mass and lump, left lower limb: Secondary | ICD-10-CM | POA: Diagnosis present

## 2016-12-20 DIAGNOSIS — M109 Gout, unspecified: Secondary | ICD-10-CM

## 2016-12-20 HISTORY — DX: Gout, unspecified: M10.9

## 2016-12-20 MED ORDER — HYDROCODONE-ACETAMINOPHEN 5-325 MG PO TABS: 1.0000 | ORAL_TABLET | ORAL | 0 refills | Status: DC | PRN

## 2016-12-20 MED ORDER — PREDNISONE 10 MG PO TABS: ORAL_TABLET | ORAL | 0 refills | Status: DC

## 2016-12-20 MED ORDER — IBUPROFEN 800 MG PO TABS
800.0000 mg | ORAL_TABLET | Freq: Once | ORAL | Status: AC
  Administered 2016-12-20: 800 mg via ORAL
  Filled 2016-12-20: qty 1

## 2016-12-20 NOTE — ED Triage Notes (Signed)
Patient complaining of left foot swelling upon awakening this morning. States he has history of gout. Denies injury.

## 2016-12-20 NOTE — Discharge Instructions (Signed)
Taking the entire course of prednisone in tapering fashion as prescribed.  Do not drive within 4 hours of taking hydrocodone as this medication will make you sleepy, but you may take this if needed for increased pain.  Elevate and use warm compresses or heating pad to her ankle which will help relieve his gout episode quicker, ice may make it hurt worse.

## 2016-12-20 NOTE — ED Provider Notes (Signed)
[facility] Provider Note   CSN: [id number] Arrival date & time: 12/20/16  1146     History   Chief Complaint Chief Complaint  Patient presents with  . Foot Swelling    HPI Leonard Rivera is a 36 y.o. male presenting with pain and swelling in his left ankle and distal left tibia since waking this morning.  He reports having a history of gout but is unsure of how this was diagnosed but todays symptoms are similar to prior episodes.  He denies injury or overuse and denies fevers, chills, skin changes, wounds or injury.  He took ibuprofen yesterday which helped some and applied ice which was not helpful.  HPI  Past Medical History:  Diagnosis Date  . Gout     Patient Active Problem List   Diagnosis Date Noted  . Morbid obesity (HCC) 05/25/2016  . Tobacco abuse 05/25/2016    History reviewed. No pertinent surgical history.     Home Medications    Prior to Admission medications   Medication Sig Start Date End Date Taking? Authorizing Provider  HYDROcodone-acetaminophen (NORCO/VICODIN) 5-325 MG tablet Take 1 tablet by mouth every 4 (four) hours as needed. 12/20/16   Burgess Amor, PA-C  predniSONE (DELTASONE) 10 MG tablet Take 6 tablets day one, 5 tablets day two, 4 tablets day three, 3 tablets day four, 2 tablets day five, then 1 tablet day six 12/20/16   Burgess Amor, PA-C    Family History History reviewed. No pertinent family history.  Social History Social History  Substance Use Topics  . Smoking status: Current Every Day Smoker  . Smokeless tobacco: Never Used  . Alcohol use No     Allergies   Patient has no known allergies.   Review of Systems Review of Systems  Constitutional: Negative for fever.  Musculoskeletal: Positive for arthralgias. Negative for joint swelling and myalgias.  Skin: Negative for color change, rash and wound.  Neurological: Negative for weakness and numbness.     Physical Exam Updated Vital Signs BP (!) 149/93   Pulse 87    Temp 98.1 F (36.7 C) (Oral)   Resp 18   Ht 5\' 8"  (1.727 m)   Wt (!) 137 kg (302 lb)   SpO2 97%   BMI 45.92 kg/m   Physical Exam  Constitutional: He appears well-developed and well-nourished.  HENT:  Head: Atraumatic.  Neck: Normal range of motion.  Cardiovascular:  Pulses equal bilaterally  Musculoskeletal: He exhibits edema and tenderness.       Left ankle: He exhibits decreased range of motion and swelling. He exhibits no ecchymosis, no deformity and normal pulse. Tenderness. Achilles tendon normal.  ttp medial, lateral and anterior ankle.  No increased warmth, subtle edema.  Normal pedal pulses.  Calf nontender and without edema.  Skin intact without lesions or wound.  Neurological: He is alert. He has normal strength. He displays normal reflexes. No sensory deficit.  Skin: Skin is warm and dry.  Psychiatric: He has a normal mood and affect.     ED Treatments / Results  Labs (all labs ordered are listed, but only abnormal results are displayed) Labs Reviewed - No data to display  EKG  EKG Interpretation None       Radiology Dg Ankle Complete Left  Result Date: 12/20/2016 CLINICAL DATA:  Left foot and ankle swelling.  No injury. EXAM: LEFT ANKLE COMPLETE - 3+ VIEW COMPARISON:  Left ankle x-rays dated December 19, 2013. FINDINGS: There is no evidence of fracture or  dislocation. Possible small tibiotalar joint effusion. The talar dome is intact. The ankle mortise is symmetric. Prominent dorsal osteophytes at the talonavicular joint are similar to prior study. 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 Soft tissues are unremarkable. IMPRESSION: No acute osseous abnormality. Possible small tibiotalar joint effusion. Electronically Signed   By: Obie Dredge M.D.   On: 12/20/2016 13:17    Procedures Procedures (including critical care time)  Medications Ordered in ED Medications  ibuprofen (ADVIL,MOTRIN) tablet 800 mg (800 mg Oral Given 12/20/16 1248)      Initial Impression / Assessment and Plan / ED Course  I have reviewed the triage vital signs and the nursing notes.  Pertinent labs & imaging results that were available during my care of the patient were reviewed by me and considered in my medical decision making (see chart for details).     Prednisone taper, hydrocodone, heat, elevation. Recheck by pcp if not improved over the next several days to 1 week.  Final Clinical Impressions(s) / ED Diagnoses   Final diagnoses:  Acute gout of left ankle, unspecified cause    New Prescriptions New Prescriptions   HYDROCODONE-ACETAMINOPHEN (NORCO/VICODIN) 5-325 MG TABLET    Take 1 tablet by mouth every 4 (four) hours as needed.   PREDNISONE (DELTASONE) 10 MG TABLET    Take 6 tablets day one, 5 tablets day two, 4 tablets day three, 3 tablets day four, 2 tablets day five, then 1 tablet day six     Leonard Rivera 12/20/16 1347    Leonard Core, MD 12/20/16 [phone]

## 2016-12-20 NOTE — ED Notes (Addendum)
C/o pain to left lower chin of leg.  Slight redness noted to left foot accompanied with swelling 2-3+ edema.  Tender when walking.

## 2017-04-12 ENCOUNTER — Emergency Department (HOSPITAL_COMMUNITY)
Admission: EM | Admit: 2017-04-12 | Discharge: 2017-04-12 | Disposition: A | Discharge status: Home / Self Care | Payer: Medicare Other | Attending: Emergency Medicine | Admitting: Emergency Medicine

## 2017-04-12 ENCOUNTER — Emergency Department (HOSPITAL_COMMUNITY): Payer: Medicare Other

## 2017-04-12 ENCOUNTER — Other Ambulatory Visit: Payer: Self-pay

## 2017-04-12 ENCOUNTER — Encounter (HOSPITAL_COMMUNITY): Payer: Self-pay | Admitting: Emergency Medicine

## 2017-04-12 DIAGNOSIS — M7051 Other bursitis of knee, right knee: Secondary | ICD-10-CM

## 2017-04-12 DIAGNOSIS — M7041 Prepatellar bursitis, right knee: Secondary | ICD-10-CM | POA: Insufficient documentation

## 2017-04-12 DIAGNOSIS — F17210 Nicotine dependence, cigarettes, uncomplicated: Secondary | ICD-10-CM | POA: Insufficient documentation

## 2017-04-12 DIAGNOSIS — Y939 Activity, unspecified: Secondary | ICD-10-CM | POA: Diagnosis not present

## 2017-04-12 DIAGNOSIS — Z79899 Other long term (current) drug therapy: Secondary | ICD-10-CM | POA: Diagnosis not present

## 2017-04-12 DIAGNOSIS — M25561 Pain in right knee: Secondary | ICD-10-CM | POA: Diagnosis not present

## 2017-04-12 MED ORDER — IBUPROFEN 600 MG PO TABS: 600.0000 mg | ORAL_TABLET | Freq: Four times a day (QID) | ORAL | 0 refills | Status: DC | PRN

## 2017-04-12 MED ORDER — IBUPROFEN 800 MG PO TABS
800.0000 mg | ORAL_TABLET | Freq: Once | ORAL | Status: AC
  Administered 2017-04-12: 800 mg via ORAL
  Filled 2017-04-12: qty 1

## 2017-04-12 MED ORDER — HYDROCODONE-ACETAMINOPHEN 5-325 MG PO TABS: 2.0000 | ORAL_TABLET | ORAL | 0 refills | Status: DC | PRN

## 2017-04-12 NOTE — ED Provider Notes (Signed)
[facility] Provider Note   CSN: [id number] Arrival date & time: 04/12/17  1428     History   Chief Complaint Chief Complaint  Patient presents with  . Knee Pain    HPI Leonard Rivera is a 36 y.o. male.  The history is provided by the patient. No language interpreter was used.  Knee Pain   This is a new problem. The current episode started 6 to 12 hours ago. The problem occurs constantly. The problem has been gradually worsening. The pain is present in the right knee. The quality of the pain is described as aching. The pain is moderate. Associated symptoms include limited range of motion. He has tried nothing for the symptoms. The treatment provided no relief.  Pt complains of pain in his right knee.  Pt has been working on knees.   Past Medical History:  Diagnosis Date  . Gout     Patient Active Problem List   Diagnosis Date Noted  . Morbid obesity (HCC) 05/25/2016  . Tobacco abuse 05/25/2016    History reviewed. No pertinent surgical history.     Home Medications    Prior to Admission medications   Medication Sig Start Date End Date Taking? Authorizing Provider  HYDROcodone-acetaminophen (NORCO/VICODIN) 5-325 MG tablet Take 2 tablets by mouth every 4 (four) hours as needed. 04/12/17   Elson AreasSofia, Rondel Episcopo K, PA-C  ibuprofen (ADVIL,MOTRIN) 600 MG tablet Take 1 tablet (600 mg total) by mouth every 6 (six) hours as needed. 04/12/17   Elson AreasSofia, Jamella Grayer K, PA-C  predniSONE (DELTASONE) 10 MG tablet Take 6 tablets day one, 5 tablets day two, 4 tablets day three, 3 tablets day four, 2 tablets day five, then 1 tablet day six 12/20/16   Burgess AmorIdol, Julie, PA-C    Family History History reviewed. No pertinent family history.  Social History Social History   Tobacco Use  . Smoking status: Current Every Day Smoker  . Smokeless tobacco: Never Used  Substance Use Topics  . Alcohol use: No  . Drug use: No     Allergies   Patient has no known  allergies.   Review of Systems Review of Systems  All other systems reviewed and are negative.    Physical Exam Updated Vital Signs BP (!) 168/79 (BP Location: Left Arm)   Pulse 87   Temp 98.5 F (36.9 C) (Oral)   Resp 18   Wt (!) 140.6 kg (310 lb)   SpO2 100%   BMI 47.14 kg/m   Physical Exam  Constitutional: He appears well-developed and well-nourished.  HENT:  Head: Normocephalic and atraumatic.  Eyes: Conjunctivae are normal.  Cardiovascular:  No murmur heard. Pulmonary/Chest: No respiratory distress.  Abdominal: There is no tenderness.  Musculoskeletal: He exhibits no edema.  Tender right knee to palpation,  nv and ns intact,  Pain with range of motion,  Tender prepatellar bursa  Neurological: He is alert.  Skin: Skin is warm and dry.  Psychiatric: He has a normal mood and affect.  Nursing note and vitals reviewed.    ED Treatments / Results  Labs (all labs ordered are listed, but only abnormal results are displayed) Labs Reviewed - No data to display  EKG  EKG Interpretation None       Radiology Dg Knee Complete 4 Views Right  Result Date: 04/12/2017 CLINICAL DATA:  Patient woke up this morning with right knee pain and swelling. EXAM: RIGHT KNEE - COMPLETE 4+ VIEW COMPARISON:  None. FINDINGS: No evidence of fracture, dislocation,  or joint effusion. Mild femorotibial joint space narrowing with spurring off the tibial plateau and lateral femoral condyle is identified. No joint effusion. Vascular phleboliths are noted along the anteromedial aspect of the knee. IMPRESSION: Mild degenerative joint space narrowing and spurring about the femorotibial compartment. No acute fracture, joint effusion or dislocation. Electronically Signed   By: Tollie Ethavid  Kwon M.D.   On: 04/12/2017 17:24    Procedures Procedures (including critical care time)  Medications Ordered in ED Medications  ibuprofen (ADVIL,MOTRIN) tablet 800 mg (800 mg Oral Given 04/12/17 1708)      Initial Impression / Assessment and Plan / ED Course  I have reviewed the triage vital signs and the nursing notes.  Pertinent labs & imaging results that were available during my care of the patient were reviewed by me and considered in my medical decision making (see chart for details).     Ace wrap Avoid kneeling Ice Meds ordered this encounter  Medications  . ibuprofen (ADVIL,MOTRIN) tablet 800 mg  . ibuprofen (ADVIL,MOTRIN) 600 MG tablet    Sig: Take 1 tablet (600 mg total) by mouth every 6 (six) hours as needed.    Dispense:  30 tablet    Refill:  0    Order Specific Question:   Supervising Provider    Answer:   MILLER, BRIAN [[id number]]  . HYDROcodone-acetaminophen (NORCO/VICODIN) 5-325 MG tablet    Sig: Take 2 tablets by mouth every 4 (four) hours as needed.    Dispense:  10 tablet    Refill:  0    Order Specific Question:   Supervising Provider    Answer:   [name] HongMILLER, BRIAN [[id number]]    Final Clinical Impressions(s) / ED Diagnoses   Final diagnoses:  Bursitis of other bursa of right knee    ED Discharge Orders        Ordered    ibuprofen (ADVIL,MOTRIN) 600 MG tablet  Every 6 hours PRN     04/12/17 1744    HYDROcodone-acetaminophen (NORCO/VICODIN) 5-325 MG tablet  Every 4 hours PRN     04/12/17 1744    An After Visit Summary was printed and given to the patient.    Elson AreasSofia, Mirka Barbone K, [place] JerseyPA-C 04/12/17 1849    Maia PlanLong, Joshua G, MD 04/13/17 [phone]

## 2017-04-12 NOTE — Discharge Instructions (Signed)
Return if any problems.

## 2017-04-12 NOTE — ED Triage Notes (Signed)
Pt c/o right knee swelling/pain this am. Denies injury or previous sx's. Nad. Mild limp noted

## 2017-05-04 ENCOUNTER — Other Ambulatory Visit: Payer: Self-pay

## 2017-05-04 ENCOUNTER — Encounter (HOSPITAL_COMMUNITY): Payer: Self-pay | Admitting: *Deleted

## 2017-05-04 ENCOUNTER — Emergency Department (HOSPITAL_COMMUNITY)
Admission: EM | Admit: 2017-05-04 | Discharge: 2017-05-04 | Disposition: A | Discharge status: Home / Self Care | Payer: Medicare Other | Attending: Emergency Medicine | Admitting: Emergency Medicine

## 2017-05-04 DIAGNOSIS — R51 Headache: Secondary | ICD-10-CM | POA: Insufficient documentation

## 2017-05-04 DIAGNOSIS — Z79899 Other long term (current) drug therapy: Secondary | ICD-10-CM | POA: Diagnosis not present

## 2017-05-04 DIAGNOSIS — F17210 Nicotine dependence, cigarettes, uncomplicated: Secondary | ICD-10-CM | POA: Insufficient documentation

## 2017-05-04 DIAGNOSIS — R519 Headache, unspecified: Secondary | ICD-10-CM

## 2017-05-04 LAB — COOXEMETRY PANEL
Carboxyhemoglobin: 4.1 % — ABNORMAL HIGH (ref 0.5–1.5)
Methemoglobin: 0.7 % (ref 0.0–1.5)
O2 Saturation: 93.9 %
Total hemoglobin: 14.6 g/dL (ref 12.0–16.0)
Total oxygen content: 18.4 mL/dL (ref 15.0–23.0)

## 2017-05-04 MED ORDER — METOCLOPRAMIDE HCL 5 MG/ML IJ SOLN
10.0000 mg | Freq: Once | INTRAMUSCULAR | Status: AC
  Administered 2017-05-04: 10 mg via INTRAMUSCULAR
  Filled 2017-05-04: qty 2

## 2017-05-04 MED ORDER — DIPHENHYDRAMINE HCL 50 MG/ML IJ SOLN
25.0000 mg | Freq: Once | INTRAMUSCULAR | Status: AC
  Administered 2017-05-04: 25 mg via INTRAMUSCULAR
  Filled 2017-05-04: qty 1

## 2017-05-04 MED ORDER — KETOROLAC TROMETHAMINE 60 MG/2ML IM SOLN
60.0000 mg | Freq: Once | INTRAMUSCULAR | Status: AC
  Administered 2017-05-04: 60 mg via INTRAMUSCULAR
  Filled 2017-05-04: qty 2

## 2017-05-04 NOTE — ED Provider Notes (Signed)
[facility] Provider [facility]   CSN: [id number] Arrival date & time: 05/04/17  0446     History   Chief Complaint Chief Complaint  Patient presents with  . Headache    HPI Leonard Rivera is a 37 y.o. male.  Patient presents with a 2-day history of frontal headache that was gradual in onset.  States headache started when he was resting.  Denies sudden onset.  Took ibuprofen at home without relief.  Denies any similar headache in the past.  Denies any photophobia or phonophobia.  No fever, chills, nausea or vomiting.  Denies any focal weakness, numbness or tingling.  Denies any visual change.  Denies any runny nose, sore throat, cough, neck pain.  Patient hypertensive on arrival does not take medication for this or anything else.   The history is provided by the patient.  Headache   Pertinent negatives include no fever, no shortness of breath, no nausea and no vomiting.    Past Medical History:  Diagnosis Date  . Gout     Patient Active Problem List   Diagnosis Date Noted  . Morbid obesity (HCC) 05/25/2016  . Tobacco abuse 05/25/2016    History reviewed. No pertinent surgical history.     Home Medications    Prior to Admission medications   Medication Sig Start Date End Date Taking? Authorizing Provider  HYDROcodone-acetaminophen (NORCO/VICODIN) 5-325 MG tablet Take 2 tablets by mouth every 4 (four) hours as needed. 04/12/17   Elson AreasSofia, Leslie K, PA-C  ibuprofen (ADVIL,MOTRIN) 600 MG tablet Take 1 tablet (600 mg total) by mouth every 6 (six) hours as needed. 04/12/17   Elson AreasSofia, Leslie K, PA-C  predniSONE (DELTASONE) 10 MG tablet Take 6 tablets day one, 5 tablets day two, 4 tablets day three, 3 tablets day four, 2 tablets day five, then 1 tablet day six 12/20/16   Burgess AmorIdol, Julie, PA-C    Family History History reviewed. No pertinent family history.  Social History Social History   Tobacco Use  . Smoking status: Current Every Day Smoker  . Smokeless  tobacco: Never Used  Substance Use Topics  . Alcohol use: No  . Drug use: No     Allergies   Patient has no known allergies.   Review of Systems Review of Systems  Constitutional: Negative for activity change, appetite change and fever.  HENT: Negative for congestion and rhinorrhea.   Eyes: Negative for photophobia, pain and visual disturbance.  Respiratory: Negative for cough, chest tightness and shortness of breath.   Cardiovascular: Negative for chest pain.  Gastrointestinal: Negative for abdominal pain, nausea and vomiting.  Genitourinary: Negative for dysuria, hematuria and testicular pain.  Musculoskeletal: Negative for arthralgias and myalgias.  Skin: Negative for rash.  Neurological: Positive for headaches. Negative for dizziness, weakness and numbness.    all other systems are negative except as noted in the HPI and PMH.    Physical Exam Updated Vital Signs BP (!) 169/98   Pulse 82   Temp 97.7 F (36.5 C) (Oral)   Resp 18   Ht 5\' 8"  (1.727 m)   Wt (!) 140.6 kg (310 lb)   SpO2 97%   BMI 47.14 kg/m   Physical Exam  Constitutional: He is oriented to person, place, and time. He appears well-developed and well-nourished. No distress.  Obese Smells of kerosene  HENT:  Head: Normocephalic and atraumatic.  Mouth/Throat: Oropharynx is clear and moist. No oropharyngeal exudate.  Eyes: Conjunctivae and EOM are normal. Pupils are equal, round, and  reactive to light.  Neck: Normal range of motion. Neck supple.  No meningismus.  Cardiovascular: Normal rate, regular rhythm, normal heart sounds and intact distal pulses.  No murmur heard. Pulmonary/Chest: Effort normal and breath sounds normal. No respiratory distress.  Abdominal: Soft. There is no tenderness. There is no rebound and no guarding.  Musculoskeletal: Normal range of motion. He exhibits no edema or tenderness.  Neurological: He is alert and oriented to person, place, and time. No cranial nerve deficit. He  exhibits normal muscle tone. Coordination normal.  CN 2-12 intact, no ataxia on finger to nose, no nystagmus, 5/5 strength throughout, no pronator drift, Romberg negative, normal gait.   Skin: Skin is warm.  Psychiatric: He has a normal mood and affect. His behavior is normal.  Nursing note and vitals reviewed.    ED Treatments / Results  Labs (all labs ordered are listed, but only abnormal results are displayed) Labs Reviewed  COOXEMETRY PANEL - Abnormal; Notable for the following components:      Result Value   Carboxyhemoglobin 4.1 (*)    All other components within normal limits    EKG  EKG Interpretation None       Radiology No results found.  Procedures Procedures (including critical care time)  Medications Ordered in ED Medications  ketorolac (TORADOL) injection 60 mg (not administered)  metoCLOPramide (REGLAN) injection 10 mg (not administered)  diphenhydrAMINE (BENADRYL) injection 25 mg (not administered)     Initial Impression / Assessment and Plan / ED Course  I have reviewed the triage vital signs and the nursing notes.  Pertinent labs & imaging results that were available during my care of the patient were reviewed by me and considered in my medical decision making (see chart for details).    Gradual onset headache times 2 days without any neurological deficits.  No fever.  Low suspicion for subarachnoid hemorrhage, meningitis, temporal arteritis. No indication for CT imaging.  Patient does smell of kerosene and has a kerosene heater at home.  Will check carboxyhemoglobin  Carboxy hemoglobin 4.1%.  This appears acceptable given patient's cigar smoking.  Blood pressure has improved.  Headache is improved with Toradol, Reglan and Benadryl.  Advised patient to establish care with PCP.  Monitor blood pressure closely as this may be contributing to his headache.  Follow-up with PCP.  Return precautions discussed  Final Clinical Impressions(s) / ED  Diagnoses   Final diagnoses:  Bad headache    ED Discharge Orders    None       Holle Sprick, Jeannett Senior, MD 05/04/17 [phone]

## 2017-05-04 NOTE — Discharge Instructions (Signed)
Stop smoking.  Follow-up with your doctor.  Monitor your blood pressure closely as elevated blood pressure may be contributing to your headaches.  Return to the ED if you develop new or worsening symptoms.

## 2017-05-04 NOTE — ED Triage Notes (Signed)
Pt c/o headache x 2 days; pt c/o headache to front of head; pt denies any other symptoms

## 2017-06-22 DIAGNOSIS — M79671 Pain in right foot: Secondary | ICD-10-CM | POA: Diagnosis not present

## 2017-06-22 DIAGNOSIS — R6889 Other general symptoms and signs: Secondary | ICD-10-CM | POA: Diagnosis not present

## 2017-12-04 ENCOUNTER — Encounter (HOSPITAL_COMMUNITY): Payer: Self-pay | Admitting: Emergency Medicine

## 2017-12-04 ENCOUNTER — Emergency Department (HOSPITAL_COMMUNITY): Payer: No Typology Code available for payment source

## 2017-12-04 ENCOUNTER — Emergency Department (HOSPITAL_COMMUNITY)
Admission: EM | Admit: 2017-12-04 | Discharge: 2017-12-05 | Disposition: A | Discharge status: Home / Self Care | Payer: No Typology Code available for payment source | Attending: Emergency Medicine | Admitting: Emergency Medicine

## 2017-12-04 ENCOUNTER — Other Ambulatory Visit: Payer: Self-pay

## 2017-12-04 DIAGNOSIS — I10 Essential (primary) hypertension: Secondary | ICD-10-CM | POA: Diagnosis not present

## 2017-12-04 DIAGNOSIS — Y92410 Unspecified street and highway as the place of occurrence of the external cause: Secondary | ICD-10-CM | POA: Diagnosis not present

## 2017-12-04 DIAGNOSIS — Y9389 Activity, other specified: Secondary | ICD-10-CM | POA: Insufficient documentation

## 2017-12-04 DIAGNOSIS — S3992XA Unspecified injury of lower back, initial encounter: Secondary | ICD-10-CM | POA: Diagnosis not present

## 2017-12-04 DIAGNOSIS — S299XXA Unspecified injury of thorax, initial encounter: Secondary | ICD-10-CM | POA: Diagnosis present

## 2017-12-04 DIAGNOSIS — M549 Dorsalgia, unspecified: Secondary | ICD-10-CM | POA: Diagnosis not present

## 2017-12-04 DIAGNOSIS — M546 Pain in thoracic spine: Secondary | ICD-10-CM | POA: Diagnosis not present

## 2017-12-04 DIAGNOSIS — I959 Hypotension, unspecified: Secondary | ICD-10-CM | POA: Diagnosis not present

## 2017-12-04 DIAGNOSIS — S39012A Strain of muscle, fascia and tendon of lower back, initial encounter: Secondary | ICD-10-CM | POA: Insufficient documentation

## 2017-12-04 DIAGNOSIS — F17200 Nicotine dependence, unspecified, uncomplicated: Secondary | ICD-10-CM | POA: Insufficient documentation

## 2017-12-04 DIAGNOSIS — S29019A Strain of muscle and tendon of unspecified wall of thorax, initial encounter: Secondary | ICD-10-CM

## 2017-12-04 DIAGNOSIS — S29012A Strain of muscle and tendon of back wall of thorax, initial encounter: Secondary | ICD-10-CM | POA: Insufficient documentation

## 2017-12-04 DIAGNOSIS — M25519 Pain in unspecified shoulder: Secondary | ICD-10-CM | POA: Diagnosis not present

## 2017-12-04 DIAGNOSIS — R0902 Hypoxemia: Secondary | ICD-10-CM | POA: Diagnosis not present

## 2017-12-04 DIAGNOSIS — Y999 Unspecified external cause status: Secondary | ICD-10-CM | POA: Diagnosis not present

## 2017-12-04 DIAGNOSIS — M545 Low back pain: Secondary | ICD-10-CM | POA: Diagnosis not present

## 2017-12-04 MED ORDER — IBUPROFEN 800 MG PO TABS
800.0000 mg | ORAL_TABLET | Freq: Once | ORAL | Status: AC
  Administered 2017-12-04: 800 mg via ORAL
  Filled 2017-12-04: qty 1

## 2017-12-04 MED ORDER — TRAMADOL HCL 50 MG PO TABS
50.0000 mg | ORAL_TABLET | Freq: Once | ORAL | Status: AC
Start: 2017-12-04 — End: 2017-12-04
  Administered 2017-12-04: 50 mg via ORAL
  Filled 2017-12-04: qty 1

## 2017-12-04 MED ORDER — CYCLOBENZAPRINE HCL 10 MG PO TABS
10.0000 mg | ORAL_TABLET | Freq: Once | ORAL | Status: AC
  Administered 2017-12-04: 10 mg via ORAL
  Filled 2017-12-04: qty 1
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
## 2017-12-05 DIAGNOSIS — M549 Dorsalgia, unspecified: Secondary | ICD-10-CM | POA: Diagnosis not present

## 2017-12-05 DIAGNOSIS — M545 Low back pain: Secondary | ICD-10-CM | POA: Diagnosis not present

## 2017-12-05 DIAGNOSIS — S299XXA Unspecified injury of thorax, initial encounter: Secondary | ICD-10-CM | POA: Diagnosis not present

## 2017-12-05 MED ORDER — CYCLOBENZAPRINE HCL 10 MG PO TABS: 10.0000 mg | ORAL_TABLET | Freq: Three times a day (TID) | ORAL | 0 refills | Status: DC | PRN

## 2017-12-05 MED ORDER — NAPROXEN 500 MG PO TABS: 500.0000 mg | ORAL_TABLET | Freq: Two times a day (BID) | ORAL | 0 refills | Status: DC

## 2017-12-05 MED ORDER — TRAMADOL HCL 50 MG PO TABS: 50.0000 mg | ORAL_TABLET | Freq: Four times a day (QID) | ORAL | 0 refills | Status: DC | PRN

## 2017-12-05 NOTE — Discharge Instructions (Signed)
Apply ice several times a day. Take acetaminophen as needed for additional pain relief.

## 2018-05-09 ENCOUNTER — Ambulatory Visit: Payer: Self-pay | Admitting: Family Medicine

## 2018-05-15 ENCOUNTER — Ambulatory Visit (INDEPENDENT_AMBULATORY_CARE_PROVIDER_SITE_OTHER): Payer: Medicare Other | Admitting: Family Medicine

## 2018-05-15 ENCOUNTER — Encounter: Payer: Self-pay | Admitting: Family Medicine

## 2018-05-15 VITALS — BP 137/94 | HR 84 | Temp 98.7°F | Ht 68.0 in | Wt 344.0 lb

## 2018-05-15 DIAGNOSIS — G8929 Other chronic pain: Secondary | ICD-10-CM | POA: Diagnosis not present

## 2018-05-15 DIAGNOSIS — M546 Pain in thoracic spine: Secondary | ICD-10-CM | POA: Diagnosis not present

## 2018-05-15 MED ORDER — METHYLPREDNISOLONE (PAK) 4 MG PO TABS: ORAL_TABLET | ORAL | 0 refills | Status: DC

## 2018-05-15 NOTE — Patient Instructions (Signed)
Acute Back Pain, Adult  Acute back pain is sudden and usually short-lived. It is often caused by an injury to the muscles and tissues in the back. The injury may result from:   A muscle or ligament getting overstretched or torn (strained). Ligaments are tissues that connect bones to each other. Lifting something improperly can cause a back strain.   Wear and tear (degeneration) of the spinal disks. Spinal disks are circular tissue that provides cushioning between the bones of the spine (vertebrae).   Twisting motions, such as while playing sports or doing yard work.   A hit to the back.   Arthritis.  You may have a physical exam, lab tests, and imaging tests to find the cause of your pain. Acute back pain usually goes away with rest and home care.  Follow these instructions at home:  Managing pain, stiffness, and swelling   Take over-the-counter and prescription medicines only as told by your health care provider.   Your health care provider may recommend applying ice during the first 24-48 hours after your pain starts. To do this:  ? Put ice in a plastic bag.  ? Place a towel between your skin and the bag.  ? Leave the ice on for 20 minutes, 2-3 times a day.   If directed, apply heat to the affected area as often as told by your health care provider. Use the heat source that your health care provider recommends, such as a moist heat pack or a heating pad.  ? Place a towel between your skin and the heat source.  ? Leave the heat on for 20-30 minutes.  ? Remove the heat if your skin turns bright red. This is especially important if you are unable to feel pain, heat, or cold. You have a greater risk of getting burned.  Activity     Do not stay in bed. Staying in bed for more than 1-2 days can delay your recovery.   Sit up and stand up straight. Avoid leaning forward when you sit, or hunching over when you stand.  ? If you work at a desk, sit close to it so you do not need to lean over. Keep your chin tucked  in. Keep your neck drawn back, and keep your elbows bent at a right angle. Your arms should look like the letter "L."  ? Sit high and close to the steering wheel when you drive. Add lower back (lumbar) support to your car seat, if needed.   Take short walks on even surfaces as soon as you are able. Try to increase the length of time you walk each day.   Do not sit, drive, or stand in one place for more than 30 minutes at a time. Sitting or standing for long periods of time can put stress on your back.   Do not drive or use heavy machinery while taking prescription pain medicine.   Use proper lifting techniques. When you bend and lift, use positions that put less stress on your back:  ? Bend your knees.  ? Keep the load close to your body.  ? Avoid twisting.   Exercise regularly as told by your health care provider. Exercising helps your back heal faster and helps prevent back injuries by keeping muscles strong and flexible.   Work with a physical therapist to make a safe exercise program, as recommended by your health care provider. Do any exercises as told by your physical therapist.  Lifestyle   Maintain   a healthy weight. Extra weight puts stress on your back and makes it difficult to have good posture.   Avoid activities or situations that make you feel anxious or stressed. Stress and anxiety increase muscle tension and can make back pain worse. Learn ways to manage anxiety and stress, such as through exercise.  General instructions   Sleep on a firm mattress in a comfortable position. Try lying on your side with your knees slightly bent. If you lie on your back, put a pillow under your knees.   Follow your treatment plan as told by your health care provider. This may include:  ? Cognitive or behavioral therapy.  ? Acupuncture or massage therapy.  ? Meditation or yoga.  Contact a health care provider if:   You have pain that is not relieved with rest or medicine.   You have increasing pain going down  into your legs or buttocks.   Your pain does not improve after 2 weeks.   You have pain at night.   You lose weight without trying.   You have a fever or chills.  Get help right away if:   You develop new bowel or bladder control problems.   You have unusual weakness or numbness in your arms or legs.   You develop nausea or vomiting.   You develop abdominal pain.   You feel faint.  Summary   Acute back pain is sudden and usually short-lived.   Use proper lifting techniques. When you bend and lift, use positions that put less stress on your back.   Take over-the-counter and prescription medicines and apply heat or ice as directed by your health care provider.  This information is not intended to replace advice given to you by your health care provider. Make sure you discuss any questions you have with your health care provider.  Document Released: 04/19/2005 Document Revised: 11/24/2017 Document Reviewed: 12/01/2016  [employer] Interactive Patient Education  2019 [employer]
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
## 2018-05-22 ENCOUNTER — Ambulatory Visit: Payer: Medicare Other | Attending: Family Medicine | Admitting: Physical Therapy

## 2018-05-22 DIAGNOSIS — M545 Low back pain, unspecified: Secondary | ICD-10-CM

## 2018-05-22 DIAGNOSIS — M546 Pain in thoracic spine: Secondary | ICD-10-CM | POA: Diagnosis not present

## 2018-05-22 DIAGNOSIS — R293 Abnormal posture: Secondary | ICD-10-CM | POA: Diagnosis not present

## 2018-05-22 DIAGNOSIS — G8929 Other chronic pain: Secondary | ICD-10-CM | POA: Diagnosis not present
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
## 2018-05-24 ENCOUNTER — Encounter: Payer: Self-pay | Admitting: Physical Therapy

## 2018-05-24 ENCOUNTER — Ambulatory Visit: Payer: Medicare Other | Admitting: Physical Therapy

## 2018-05-24 DIAGNOSIS — M545 Low back pain: Secondary | ICD-10-CM

## 2018-05-24 DIAGNOSIS — G8929 Other chronic pain: Secondary | ICD-10-CM | POA: Diagnosis not present

## 2018-05-24 DIAGNOSIS — R293 Abnormal posture: Secondary | ICD-10-CM | POA: Diagnosis not present

## 2018-05-24 DIAGNOSIS — M546 Pain in thoracic spine: Secondary | ICD-10-CM | POA: Diagnosis not present

## 2018-05-24 NOTE — Therapy (Signed)
[facility] [address] [place], [place], [address] Phone: [phone]   Fax:  [phone]  Physical Therapy Treatment  Patient Details  Name: Leonard Rivera MRN: [id number] Date of Birth: Mar 31, 1981 Referring Provider (PT): Gilford Silvius FNP.   Encounter Date: 05/24/2018  PT End of Session - 05/24/18 1024    Visit Number  2    Number of Visits  12    Date for PT Re-Evaluation  07/03/18    PT Start Time  0946    PT Stop Time  1029    PT Time Calculation (min)  43 min    Activity Tolerance  Patient tolerated treatment well    Behavior During Therapy  [facility] for tasks assessed/performed       Past Medical History:  Diagnosis Date  . Gout     History reviewed. No pertinent surgical history.  There were no vitals filed for this visit.  Subjective Assessment - 05/24/18 0948    Subjective  Patient arrived with ongoing discomfort    Pertinent History  Gout.    Patient Stated Goals  Get out of pain.    Currently in Pain?  Yes    Pain Score  7     Pain Location  Back    Pain Orientation  Left;Right;Mid    Pain Descriptors / Indicators  Aching    Pain Type  Chronic pain    Pain Onset  More than a month ago    Pain Frequency  Constant    Aggravating Factors   first thisng in AM    Pain Relieving Factors  at rest                       [facility] Adult PT Treatment/Exercise - 05/24/18 0001      Modalities   Modalities  Moist Heat;Electrical Stimulation;Ultrasound      Moist Heat Therapy   Number Minutes Moist Heat  15 Minutes    Moist Heat Location  --   mid/low back     Electrical Stimulation   Electrical Stimulation Location  Lower thoracic/upper lumbar.    Electrical Stimulation Action  IFC    Electrical Stimulation Parameters  80-150hz  x89min    Electrical Stimulation Goals  Pain      Ultrasound   Ultrasound Location  bil thoracic spine    Ultrasound Parameters  1.5w/cm2/50%/22mhz x46min    Ultrasound Goals   Pain      Manual Therapy   Manual Therapy  Myofascial release;Soft tissue mobilization    Manual therapy comments  manual/IASTM to bil thoracic spine to reduce pain and tone    Soft tissue mobilization  manual MFR/STW to thoracic paraspinals to reduce tightness             PT Education - 05/24/18 1028    Education Details  posture awareness techniques, scapular retraction and core activation    Person(s) Educated  Patient    Methods  Explanation;Demonstration    Comprehension  Verbalized understanding          PT Long Term Goals - 05/22/18 1223      PT LONG TERM GOAL #1   Title  Independent with a HEP.    Time  6    Period  Weeks    Status  New      PT LONG TERM GOAL #2   Title  Stand 30 minutes with pain not > 2/10.    Time  6  Period  Weeks    Status  New      PT LONG TERM GOAL #3   Title  Perform ADL's with pain not > 2/10.    Time  6    Period  Weeks    Status  New            Plan - 05/24/18 1025    Clinical Impression Statement  Patient tolerated treatment well today. Patient was educated on posture awareness techniques and gentle exercises for posture and low back to help improve patients current condition. Patient reported more pain in thoracic spine today. Today focused on conservitive treatment due to increased pain reported. Patient responded wel today with a normal response upon removal of modalities. Goals ongoing at this time.     Rehab Potential  Excellent    PT Frequency  2x / week    PT Duration  6 weeks    PT Treatment/Interventions  ADLs/Self Care Home Management;Cryotherapy;Electrical Stimulation;Ultrasound;Moist Heat;Therapeutic activities;Therapeutic exercise;Patient/family education;Manual techniques    PT Next Visit Plan  assess and then cont with Modalites and STW/M to affected spinal musculature and progress thoracic and lumbar exercises    Consulted and Agree with Plan of Care  Patient       Patient will benefit from skilled  therapeutic intervention in order to improve the following deficits and impairments:  Pain, Decreased range of motion, Decreased activity tolerance, Postural dysfunction  Visit Diagnosis: Pain in thoracic spine  Chronic bilateral low back pain without sciatica  Abnormal posture     Problem List Patient Active Problem List   Diagnosis Date Noted  . Morbid obesity (HCC) 05/25/2016  . Tobacco abuse 05/25/2016    Hermelinda Dellen, PTA 05/24/2018, 10:37 AM  [facility] [address] [place], [place], [address] Phone: [phone]   Fax:  [phone]  Name: Leonard Rivera MRN: [id number] Date of Birth: 09/12/1980

## 2018-05-30 ENCOUNTER — Ambulatory Visit: Payer: Medicare Other | Admitting: *Deleted

## 2018-05-30 DIAGNOSIS — G8929 Other chronic pain: Secondary | ICD-10-CM | POA: Diagnosis not present

## 2018-05-30 DIAGNOSIS — R293 Abnormal posture: Secondary | ICD-10-CM

## 2018-05-30 DIAGNOSIS — M546 Pain in thoracic spine: Secondary | ICD-10-CM | POA: Diagnosis not present

## 2018-05-30 DIAGNOSIS — M545 Low back pain, unspecified: Secondary | ICD-10-CM

## 2018-05-30 NOTE — Therapy (Signed)
[facility] [address] [place], [place], [address] Phone: [phone]   Fax:  [phone]  Physical Therapy Treatment  Patient Details  Name: Anir Rosener MRN: [id number] Date of Birth: [birth year] Referring Provider (PT): Gilford Silvius FNP.   Encounter Date: 05/30/2018  PT End of Session - 05/30/18 1507    Visit Number  3    Number of Visits  12    Date for PT Re-Evaluation  07/03/18    PT Start Time  1030    PT Stop Time  1120    PT Time Calculation (min)  50 min       Past Medical History:  Diagnosis Date  . Gout     No past surgical history on file.  There were no vitals filed for this visit.  Subjective Assessment - 05/30/18 1036    Subjective  Did good after last Rx    Pertinent History  Gout.    Patient Stated Goals  Get out of pain.    Currently in Pain?  Yes    Pain Score  5     Pain Location  Back    Pain Orientation  Left;Right;Mid    Pain Descriptors / Indicators  Aching    Pain Type  Chronic pain    Pain Onset  More than a month ago    Pain Frequency  Constant                       [facility] Adult PT Treatment/Exercise - 05/30/18 0001      Modalities   Modalities  Moist Heat;Electrical Stimulation;Ultrasound      Moist Heat Therapy   Number Minutes Moist Heat  15 Minutes    Moist Heat Location  --   mid/low back     Electrical Stimulation   Electrical Stimulation Location  Lower thoracic/upper lumbar.  IFC x 15 mins at80-150hz     Electrical Stimulation Action  sitting    Electrical Stimulation Goals  Pain      Ultrasound   Ultrasound Location  Bil thoracolumbar paras in sitting    Ultrasound Parameters  1.5 w/cm2 x 12 mins     Ultrasound Goals  Pain      Manual Therapy   Manual Therapy  Myofascial release;Soft tissue mobilization    Manual therapy comments  manual/IASTM to bil thoracic spine to reduce pain and tone    Soft tissue mobilization  manual MFR/STW to thoracolumbar  paraspinals to reduce tightness in sitting                  PT Long Term Goals - 05/22/18 1223      PT LONG TERM GOAL #1   Title  Independent with a HEP.    Time  6    Period  Weeks    Status  New      PT LONG TERM GOAL #2   Title  Stand 30 minutes with pain not > 2/10.    Time  6    Period  Weeks    Status  New      PT LONG TERM GOAL #3   Title  Perform ADL's with pain not > 2/10.    Time  6    Period  Weeks    Status  New            Plan - 05/30/18 1509    Clinical Impression Statement  Pt arrived today reporting feeling better today  with less pain in mid-back. He did well with Rx, but had notable tightness in LT thoracolumbar paras during STW. Reports feeling good after session. Normal modality response    Rehab Potential  Excellent    PT Frequency  2x / week    PT Duration  6 weeks    PT Treatment/Interventions  ADLs/Self Care Home Management;Cryotherapy;Electrical Stimulation;Ultrasound;Moist Heat;Therapeutic activities;Therapeutic exercise;Patient/family education;Manual techniques    PT Next Visit Plan  assess and then cont with Modalites and STW/M to affected spinal musculature and progress thoracic and lumbar exercises    Consulted and Agree with Plan of Care  Patient       Patient will benefit from skilled therapeutic intervention in order to improve the following deficits and impairments:  Pain, Decreased range of motion, Decreased activity tolerance, Postural dysfunction  Visit Diagnosis: Pain in thoracic spine  Chronic bilateral low back pain without sciatica  Abnormal posture     Problem List Patient Active Problem List   Diagnosis Date Noted  . Morbid obesity (HCC) 05/25/2016  . Tobacco abuse 05/25/2016    Alvia Tory,CHRIS, PTA 05/30/2018, 3:30 PM  [facility] [address] [place], [place], [address] Phone: [phone]   Fax:  [phone]  Name: Lovie MacadamiaBradley Pintor MRN: [id number] Date  of Birth: 12/14/1980

## 2018-06-01 ENCOUNTER — Ambulatory Visit: Payer: Medicare Other | Admitting: *Deleted

## 2018-06-01 DIAGNOSIS — M545 Low back pain, unspecified: Secondary | ICD-10-CM

## 2018-06-01 DIAGNOSIS — R293 Abnormal posture: Secondary | ICD-10-CM | POA: Diagnosis not present

## 2018-06-01 DIAGNOSIS — M546 Pain in thoracic spine: Secondary | ICD-10-CM

## 2018-06-01 DIAGNOSIS — G8929 Other chronic pain: Secondary | ICD-10-CM

## 2018-06-01 NOTE — Therapy (Signed)
[facility] [address] [place], [place], [address] Phone: [phone]   Fax:  [phone]  Physical Therapy Treatment  Patient Details  Name: Leonard Rivera MRN: [id number] Date of Birth: 30-Jan-1981 Referring Provider (PT): Gilford Silvius FNP.   Encounter Date: 06/01/2018  PT End of Session - 06/01/18 1108    Visit Number  4    Number of Visits  12    Date for PT Re-Evaluation  07/03/18    PT Start Time  1030    PT Stop Time  1120    PT Time Calculation (min)  50 min       Past Medical History:  Diagnosis Date  . Gout     No past surgical history on file.  There were no vitals filed for this visit.  Subjective Assessment - 06/01/18 1031    Subjective  5/10 mid-back today. Getting better    Pertinent History  Gout.    Patient Stated Goals  Get out of pain.    Currently in Pain?  Yes    Pain Score  5     Pain Location  Back    Pain Type  Chronic pain    Pain Onset  More than a month ago    Pain Frequency  Constant                       [facility] Adult PT Treatment/Exercise - 06/01/18 0001      Modalities   Modalities  Moist Heat;Electrical Stimulation;Ultrasound      Moist Heat Therapy   Number Minutes Moist Heat  15 Minutes    Moist Heat Location  --   mid/low back     Electrical Stimulation   Electrical Stimulation Location  Lower thoracic/upper lumbar.  IFC x 15 mins at80-150hz     Electrical Stimulation Action  sitting    Electrical Stimulation Goals  Pain      Ultrasound   Ultrasound Location  Bil thoracolumbar paras    Ultrasound Parameters  1.5 w/cm2 x 12 mins    Ultrasound Goals  Pain      Manual Therapy   Manual Therapy  Myofascial release;Soft tissue mobilization    Manual therapy comments  manual/IASTM to bil thoracic spine to reduce pain and tone    Soft tissue mobilization  manual MFR/STW to thoracolumbar paraspinals to reduce tightness in sitting                  PT Long  Term Goals - 05/22/18 1223      PT LONG TERM GOAL #1   Title  Independent with a HEP.    Time  6    Period  Weeks    Status  New      PT LONG TERM GOAL #2   Title  Stand 30 minutes with pain not > 2/10.    Time  6    Period  Weeks    Status  New      PT LONG TERM GOAL #3   Title  Perform ADL's with pain not > 2/10.    Time  6    Period  Weeks    Status  New            Plan - 06/01/18 1110    Clinical Impression Statement  Pt arrived today reporting that Rxs are helping with less pain and discomfort when doing things. He did well with [place] combo f/b STW both sides  mid-back, Still with notable soreness both sides today during STW. Decreased pain and soreness after session.    Clinical Presentation  Evolving    Rehab Potential  Excellent    PT Frequency  2x / week    PT Duration  6 weeks    PT Treatment/Interventions  ADLs/Self Care Home Management;Cryotherapy;Electrical Stimulation;Ultrasound;Moist Heat;Therapeutic activities;Therapeutic exercise;Patient/family education;Manual techniques    PT Next Visit Plan  assess and then cont with Modalites and STW/M to affected spinal musculature and progress thoracic and lumbar exercises    Consulted and Agree with Plan of Care  Patient       Patient will benefit from skilled therapeutic intervention in order to improve the following deficits and impairments:  Pain, Decreased range of motion, Decreased activity tolerance, Postural dysfunction  Visit Diagnosis: Pain in thoracic spine  Chronic bilateral low back pain without sciatica  Abnormal posture     Problem List Patient Active Problem List   Diagnosis Date Noted  . Morbid obesity (HCC) 05/25/2016  . Tobacco abuse 05/25/2016    ,CHRIS, PTA 06/01/2018, 11:29 AM  [facility] [address] [place], [place], [address] Phone: [phone]   Fax:  [phone]  Name: Leonard Rivera MRN: [id number] Date of Birth:  1980/05/19

## 2018-06-06 ENCOUNTER — Ambulatory Visit: Payer: Medicare Other | Attending: Family Medicine | Admitting: Physical Therapy

## 2018-06-06 ENCOUNTER — Encounter: Payer: Self-pay | Admitting: Physical Therapy

## 2018-06-06 DIAGNOSIS — M546 Pain in thoracic spine: Secondary | ICD-10-CM | POA: Diagnosis not present

## 2018-06-06 DIAGNOSIS — R293 Abnormal posture: Secondary | ICD-10-CM | POA: Insufficient documentation

## 2018-06-06 DIAGNOSIS — M545 Low back pain: Secondary | ICD-10-CM | POA: Insufficient documentation

## 2018-06-06 DIAGNOSIS — G8929 Other chronic pain: Secondary | ICD-10-CM

## 2018-06-06 NOTE — Therapy (Signed)
[facility] [address] [place], [place], [address] Phone: [phone]   Fax:  [phone]  Physical Therapy Treatment  Patient Details  Name: Leonard Rivera MRN: [id number] Date of Birth: 08/10/1980 Referring Provider (PT): Gilford SilviusLinda Rakes FNP.   Encounter Date: 06/06/2018  PT End of Session - 06/06/18 1106    Visit Number  5    Number of Visits  12    Date for PT Re-Evaluation  07/03/18    PT Start Time  1030    PT Stop Time  1119    PT Time Calculation (min)  49 min    Activity Tolerance  Patient tolerated treatment well    Behavior During Therapy  WFL for tasks assessed/performed       Past Medical History:  Diagnosis Date  . Gout     History reviewed. No pertinent surgical history.  There were no vitals filed for this visit.  Subjective Assessment - 06/06/18 1106    Subjective  Reported feeling like he is getting better 5/10.     Pertinent History  Gout.    Patient Stated Goals  Get out of pain.    Currently in Pain?  Yes    Pain Score  5          [facility] PT Assessment - 06/06/18 0001      Assessment   Medical Diagnosis  Chronic midline thoracic pain.    Referring Provider (PT)  Gilford SilviusLinda Rakes FNP.      Precautions   Precautions  None                   [facility] Adult PT Treatment/Exercise - 06/06/18 0001      Modalities   Modalities  Moist Heat;Electrical Stimulation;Ultrasound      Moist Heat Therapy   Number Minutes Moist Heat  15 Minutes    Moist Heat Location  Other (comment)   thoracolumbar     Electrical Stimulation   Electrical Stimulation Location  Lower thoracic/upper lumbar.  IFC x 15 mins at80-150hz     Electrical Stimulation Action  sitting    Electrical Stimulation Goals  Pain      Ultrasound   Ultrasound Location  bilateral thoracolumbar paraspinals    Ultrasound Parameters  1.5 w/cm2, 100% 1mhz    Ultrasound Goals  Pain      Manual Therapy   Manual Therapy  Myofascial release;Soft  tissue mobilization    Manual therapy comments  manual/IASTM to bil thoracic spine to reduce pain and tone    Soft tissue mobilization  manual MFR/STW to thoracolumbar paraspinals to reduce tightness in sitting                  PT Long Term Goals - 05/22/18 1223      PT LONG TERM GOAL #1   Title  Independent with a HEP.    Time  6    Period  Weeks    Status  New      PT LONG TERM GOAL #2   Title  Stand 30 minutes with pain not > 2/10.    Time  6    Period  Weeks    Status  New      PT LONG TERM GOAL #3   Title  Perform ADL's with pain not > 2/10.    Time  6    Period  Weeks    Status  New  Plan - 06/06/18 1106    Clinical Impression Statement  Patient was able to tolerate treatment well. Patient continues to have increased muscle tension in bilateral lumbar paraspinals but noted with some release after STW/M and IASTM. Patient reported compliance with stretching at home. Normal response to modalities upon removal.    Clinical Presentation  Evolving    Clinical Decision Making  Low    Rehab Potential  Excellent    PT Frequency  2x / week    PT Duration  6 weeks    PT Treatment/Interventions  ADLs/Self Care Home Management;Cryotherapy;Electrical Stimulation;Ultrasound;Moist Heat;Therapeutic activities;Therapeutic exercise;Patient/family education;Manual techniques    PT Next Visit Plan  assess and then cont with Modalites and STW/M to affected spinal musculature and progress thoracic and lumbar exercises    Consulted and Agree with Plan of Care  Patient       Patient will benefit from skilled therapeutic intervention in order to improve the following deficits and impairments:  Pain, Decreased range of motion, Decreased activity tolerance, Postural dysfunction  Visit Diagnosis: Pain in thoracic spine  Chronic bilateral low back pain without sciatica  Abnormal posture     Problem List Patient Active Problem List   Diagnosis Date Noted  .  Morbid obesity (HCC) 05/25/2016  . Tobacco abuse 05/25/2016   Leonard Rivera, PT, DPT 06/06/2018, 11:22 AM  [facility] [address] [place], [place], [address] Phone: [phone]   Fax:  [phone]  Name: Leonard Rivera Picardi MRN: [id number] Date of Birth: 05/10/1980

## 2018-06-08 ENCOUNTER — Encounter: Payer: Medicare Other | Admitting: Physical Therapy

## 2018-06-13 ENCOUNTER — Ambulatory Visit: Payer: Medicare Other | Admitting: Physical Therapy

## 2018-06-13 ENCOUNTER — Encounter: Payer: Self-pay | Admitting: Physical Therapy

## 2018-06-13 DIAGNOSIS — M546 Pain in thoracic spine: Secondary | ICD-10-CM

## 2018-06-13 DIAGNOSIS — R293 Abnormal posture: Secondary | ICD-10-CM | POA: Diagnosis not present

## 2018-06-13 DIAGNOSIS — G8929 Other chronic pain: Secondary | ICD-10-CM

## 2018-06-13 DIAGNOSIS — M545 Low back pain: Secondary | ICD-10-CM | POA: Diagnosis not present

## 2018-06-13 NOTE — Therapy (Signed)
[facility] [address] [place], [place], [address] Phone: [phone]   Fax:  [phone]  Physical Therapy Treatment  Patient Details  Name: Leonard Rivera MRN: [id number] Date of Birth: 12/05/1980 Referring Provider (PT): Gilford Silvius FNP.   Encounter Date: 06/13/2018  PT End of Session - 06/13/18 1024    Visit Number  6    Number of Visits  12    Date for PT Re-Evaluation  07/03/18    PT Start Time  0945    PT Stop Time  1037    PT Time Calculation (min)  52 min    Activity Tolerance  Patient tolerated treatment well    Behavior During Therapy  [facility] for tasks assessed/performed       Past Medical History:  Diagnosis Date  . Gout     History reviewed. No pertinent surgical history.  There were no vitals filed for this visit.  Subjective Assessment - 06/13/18 1023    Subjective  Patient reports ongoing 5/10 pain in thoracolumbar spine.    Pertinent History  Gout.    Patient Stated Goals  Get out of pain.    Currently in Pain?  Yes    Pain Score  5     Pain Location  Back    Pain Orientation  Mid;Right;Left    Pain Descriptors / Indicators  Aching    Pain Type  Chronic pain    Pain Onset  More than a month ago    Pain Frequency  Constant         [facility] PT Assessment - 06/13/18 0001      Assessment   Medical Diagnosis  Chronic midline thoracic pain.                   [facility] Adult PT Treatment/Exercise - 06/13/18 0001      Exercises   Exercises  Shoulder      Shoulder Exercises: Standing   Extension  Strengthening;20 reps;Limitations    Extension Limitations  Pink XTS    Row  Strengthening;Both;20 reps;Limitations    Row Limitations  Pink XTS      Shoulder Exercises: ROM/Strengthening   UBE (Upper Arm Bike)  120 RPM x8 minutes 4 fwd, 4 bwd    X to V Arms  against the wall 2x10      Modalities   Modalities  Moist Heat;Electrical Stimulation;Ultrasound      Moist Heat Therapy   Number Minutes  Moist Heat  15 Minutes    Moist Heat Location  Other (comment)   thoracolumbar     Electrical Stimulation   Electrical Stimulation Location  Lower thoracic/upper lumbar.  IFC x 15 mins at80-150hz     Electrical Stimulation Action  sitting    Electrical Stimulation Goals  Pain      Ultrasound   Ultrasound Location  bilateral thoracic paraspinals    Ultrasound Parameters  1.5w/cm2, 100% , x12 mins    Ultrasound Goals  Pain      Manual Therapy   Manual Therapy  --                  PT Long Term Goals - 06/13/18 1026      PT LONG TERM GOAL #1   Title  Independent with a HEP.    Time  6    Period  Weeks    Status  Achieved      PT LONG TERM GOAL #2   Title  Stand 30  minutes with pain not > 2/10.    Time  6    Period  Weeks    Status  On-going      PT LONG TERM GOAL #3   Title  Perform ADL's with pain not > 2/10.    Time  6    Period  Weeks    Status  On-going            Plan - 06/13/18 1028    Clinical Impression Statement  Patient was able to tolerate treatment well. Patient provided with new exercises to improve thoracolumbar and postural muscles. No increase of pain reported. Normal response to e-stim/US combo and e-stim with HMP.     Clinical Presentation  Evolving    Clinical Decision Making  Low    Rehab Potential  Excellent    PT Frequency  2x / week    PT Duration  6 weeks    PT Treatment/Interventions  ADLs/Self Care Home Management;Cryotherapy;Electrical Stimulation;Ultrasound;Moist Heat;Therapeutic activities;Therapeutic exercise;Patient/family education;Manual techniques    PT Next Visit Plan  cont with postural exercises , Modalites and STW/M to affected spinal musculature and progress thoracic and lumbar exercises    Consulted and Agree with Plan of Care  Patient       Patient will benefit from skilled therapeutic intervention in order to improve the following deficits and impairments:  Pain, Decreased range of motion, Decreased  activity tolerance, Postural dysfunction  Visit Diagnosis: Pain in thoracic spine  Chronic bilateral low back pain without sciatica  Abnormal posture     Problem List Patient Active Problem List   Diagnosis Date Noted  . Morbid obesity (HCC) 05/25/2016  . Tobacco abuse 05/25/2016   [name], PT, DPT 06/13/2018, 10:41 AM  [facility] [address] [place], [place], [address] Phone: [phone]   Fax:  [phone]  Name: Leonard Rivera MRN: [id number] Date of Birth: Sep 30, 1980

## 2018-06-15 ENCOUNTER — Encounter: Payer: Medicare Other | Admitting: Physical Therapy

## 2018-06-20 ENCOUNTER — Ambulatory Visit: Payer: Medicare Other | Admitting: *Deleted

## 2018-06-20 DIAGNOSIS — M545 Low back pain, unspecified: Secondary | ICD-10-CM

## 2018-06-20 DIAGNOSIS — R293 Abnormal posture: Secondary | ICD-10-CM

## 2018-06-20 DIAGNOSIS — G8929 Other chronic pain: Secondary | ICD-10-CM

## 2018-06-20 DIAGNOSIS — M546 Pain in thoracic spine: Secondary | ICD-10-CM | POA: Diagnosis not present

## 2018-06-20 NOTE — Therapy (Signed)
[facility] [address] [place], [place], [address] Phone: [phone]   Fax:  [phone]  Physical Therapy Treatment  Patient Details  Name: Leonard Rivera MRN: [id number] Date of Birth: 1981/03/13 Referring Provider (PT): Gilford Silvius FNP.   Encounter Date: 06/20/2018  PT End of Session - 06/20/18 0950    Visit Number  7    Number of Visits  12    Date for PT Re-Evaluation  07/03/18    PT Start Time  0945    PT Stop Time  1035    PT Time Calculation (min)  50 min    Activity Tolerance  Patient tolerated treatment well    Behavior During Therapy  [facility] for tasks assessed/performed       Past Medical History:  Diagnosis Date  . Gout     No past surgical history on file.  There were no vitals filed for this visit.  Subjective Assessment - 06/20/18 0948    Subjective  Patient reports ongoing 5/10 pain in thoracolumbar area.    Pertinent History  Gout.    Patient Stated Goals  Get out of pain.    Currently in Pain?  Yes    Pain Score  5     Pain Location  Back    Pain Orientation  Mid;Left;Right    Pain Descriptors / Indicators  Aching    Pain Type  Chronic pain    Pain Onset  More than a month ago    Pain Frequency  Constant                       [facility] Adult PT Treatment/Exercise - 06/20/18 0001      Exercises   Exercises  Shoulder      Shoulder Exercises: Standing   Extension  Strengthening;20 reps;Limitations   3x 10-15 reps   Extension Limitations  Pink XTS    Row  Strengthening;Both;20 reps;Limitations   3x 10-15 reps   Row Limitations  Pink XTS      Shoulder Exercises: ROM/Strengthening   UBE (Upper Arm Bike)  120 RPM x8 minutes 4 fwd, 4 bwd    X to V Arms  --      Modalities   Modalities  Moist Heat;Electrical Stimulation;Ultrasound      Moist Heat Therapy   Number Minutes Moist Heat  15 Minutes    Moist Heat Location  Other (comment)   thoracolumbar     Electrical Stimulation   Electrical Stimulation Location  Lower thoracic/upper lumbar.  IFC x 15 mins at80-150hz     Electrical Stimulation Action  sitting    Electrical Stimulation Goals  Pain      Ultrasound   Ultrasound Location  Bil thoracolumbar paras    Ultrasound Parameters  1.5 w/cm2 x 10 mins  sitting    Ultrasound Goals  Pain                  PT Long Term Goals - 06/13/18 1026      PT LONG TERM GOAL #1   Title  Independent with a HEP.    Time  6    Period  Weeks    Status  Achieved      PT LONG TERM GOAL #2   Title  Stand 30 minutes with pain not > 2/10.    Time  6    Period  Weeks    Status  On-going      PT LONG TERM  GOAL #3   Title  Perform ADL's with pain not > 2/10.    Time  6    Period  Weeks    Status  On-going            Plan - 06/20/18 0600    Clinical Impression Statement  Pt arrived today reporting 5/10  mid-back pain, but 30% better overall since starting PT. He did well with Therex without increased pain during or after. Normal response to [place] Estim combo and other modalities.    Clinical Presentation  Evolving    Clinical Decision Making  Low    Rehab Potential  Excellent    PT Frequency  2x / week    PT Duration  6 weeks    PT Treatment/Interventions  ADLs/Self Care Home Management;Cryotherapy;Electrical Stimulation;Ultrasound;Moist Heat;Therapeutic activities;Therapeutic exercise;Patient/family education;Manual techniques    PT Next Visit Plan  cont with postural exercises , Modalites and STW/M to affected spinal musculature and progress thoracic and lumbar exercises    Consulted and Agree with Plan of Care  Patient       Patient will benefit from skilled therapeutic intervention in order to improve the following deficits and impairments:  Pain, Decreased range of motion, Decreased activity tolerance, Postural dysfunction  Visit Diagnosis: Pain in thoracic spine  Chronic bilateral low back pain without sciatica  Abnormal posture     Problem  List Patient Active Problem List   Diagnosis Date Noted  . Morbid obesity (HCC) 05/25/2016  . Tobacco abuse 05/25/2016    RAMSEUR,CHRIS, PTA 06/20/2018, 10:46 AM  [facility] [address] [place], [place], [address] Phone: [phone]   Fax:  [phone]  Name: Leonard Rivera MRN: [id number] Date of Birth: 1980/12/10

## 2018-06-22 ENCOUNTER — Encounter: Payer: Self-pay | Admitting: Physical Therapy

## 2018-06-22 ENCOUNTER — Ambulatory Visit: Payer: Medicare Other | Admitting: Physical Therapy

## 2018-06-22 DIAGNOSIS — M545 Low back pain, unspecified: Secondary | ICD-10-CM

## 2018-06-22 DIAGNOSIS — G8929 Other chronic pain: Secondary | ICD-10-CM | POA: Diagnosis not present

## 2018-06-22 DIAGNOSIS — R293 Abnormal posture: Secondary | ICD-10-CM | POA: Diagnosis not present

## 2018-06-22 DIAGNOSIS — M546 Pain in thoracic spine: Secondary | ICD-10-CM

## 2018-06-22 NOTE — Therapy (Signed)
[facility] [address] [place], [place], [address] Phone: [phone]   Fax:  [phone]  Physical Therapy Treatment  Patient Details  Name: Leonard Rivera MRN: [id number] Date of Birth: 06/29/1980 Referring Provider (PT): Gilford Silvius FNP.   Encounter Date: 06/22/2018  PT End of Session - 06/22/18 1014    Visit Number  8    Number of Visits  12    Date for PT Re-Evaluation  07/03/18    PT Start Time  0946    PT Stop Time  1032    PT Time Calculation (min)  46 min    Activity Tolerance  Patient tolerated treatment well    Behavior During Therapy  [facility] for tasks assessed/performed       Past Medical History:  Diagnosis Date  . Gout     History reviewed. No pertinent surgical history.  There were no vitals filed for this visit.  Subjective Assessment - 06/22/18 1001    Subjective  Patient reported doing well after last treatment with ongoing discomfort    Pertinent History  Gout.    Patient Stated Goals  Get out of pain.    Currently in Pain?  Yes    Pain Score  5     Pain Location  Back    Pain Orientation  Left;Right;Mid    Pain Descriptors / Indicators  Tightness;Discomfort    Pain Type  Chronic pain    Pain Onset  More than a month ago    Pain Frequency  Intermittent    Aggravating Factors   first thing in morning and prolong standing/ADL's    Pain Relieving Factors  at rest                       [facility] Adult PT Treatment/Exercise - 06/22/18 0001      Shoulder Exercises: Standing   Extension  Strengthening;Both;20 reps;10 reps;Theraband    Extension Limitations  pink XTS    Row  Strengthening;Both;20 reps;10 reps;Other (comment)    Row Limitations  pink XTS      Shoulder Exercises: ROM/Strengthening   UBE (Upper Arm Bike)  120 RPM x8 minutes 4 fwd, 4 bwd    "W" Arms  2x10 with grey ball for resistance    X to V Arms  2x10 with grey ball for resistance      Moist Heat Therapy   Number Minutes  Moist Heat  15 Minutes    Moist Heat Location  --   mid low back     Electrical Stimulation   Electrical Stimulation Location  Lower thoracic/upper lumbar.  IFC x 15 mins at80-150hz     Electrical Stimulation Goals  Pain      Ultrasound   Ultrasound Location  bil thoracolumbar    Ultrasound Parameters  1.5w/cm2/50%/74mhz x36min    Ultrasound Goals  Pain                  PT Long Term Goals - 06/22/18 1025      PT LONG TERM GOAL #1   Title  Independent with a HEP.    Period  Weeks    Status  Achieved      PT LONG TERM GOAL #2   Title  Stand 30 minutes with pain not > 2/10.    Time  6    Period  Weeks    Status  On-going   5/10 at this time 06/22/18     PT LONG  TERM GOAL #3   Title  Perform ADL's with pain not > 2/10.    Time  6    Period  Weeks    Status  On-going   5/10 at this time 06/22/18           Plan - 06/22/18 1025    Clinical Impression Statement  Patient tolerated treatment well today. Patient able to complete all exersises with no increased discomfort. Patient has reported overall progress and doing HEP as instructed. Patient reported 5/10 pain with prolong standing and ADL's. Goals progressing at this time.     Rehab Potential  Excellent    PT Frequency  2x / week    PT Duration  6 weeks    PT Treatment/Interventions  ADLs/Self Care Home Management;Cryotherapy;Electrical Stimulation;Ultrasound;Moist Heat;Therapeutic activities;Therapeutic exercise;Patient/family education;Manual techniques    PT Next Visit Plan  cont with postural exercises , Modalites and STW/M to affected spinal musculature and progress thoracic and lumbar exercises    Consulted and Agree with Plan of Care  Patient       Patient will benefit from skilled therapeutic intervention in order to improve the following deficits and impairments:  Pain, Decreased range of motion, Decreased activity tolerance, Postural dysfunction  Visit Diagnosis: Pain in thoracic spine  Chronic  bilateral low back pain without sciatica  Abnormal posture     Problem List Patient Active Problem List   Diagnosis Date Noted  . Morbid obesity (HCC) 05/25/2016  . Tobacco abuse 05/25/2016    Hermelinda Dellen, PTA 06/22/2018, 10:39 AM  [facility] [address] [place], [place], [address] Phone: [phone]   Fax:  [phone]  Name: Leonard Rivera MRN: [id number] Date of Birth: [birth year]

## 2018-06-27 ENCOUNTER — Ambulatory Visit: Payer: Medicare Other | Admitting: *Deleted

## 2018-06-27 DIAGNOSIS — M545 Low back pain: Secondary | ICD-10-CM

## 2018-06-27 DIAGNOSIS — R293 Abnormal posture: Secondary | ICD-10-CM | POA: Diagnosis not present

## 2018-06-27 DIAGNOSIS — G8929 Other chronic pain: Secondary | ICD-10-CM | POA: Diagnosis not present

## 2018-06-27 DIAGNOSIS — M546 Pain in thoracic spine: Secondary | ICD-10-CM

## 2018-06-27 NOTE — Therapy (Signed)
[facility] [address] [place], [place], [address] Phone: [phone]   Fax:  [phone]  Physical Therapy Treatment  Patient Details  Name: Leonard Rivera MRN: [id number] Date of Birth: 07-19-1980 Referring Provider (PT): Gilford Silvius FNP.   Encounter Date: 06/27/2018  PT End of Session - 06/27/18 1033    Visit Number  9    Number of Visits  12    Date for PT Re-Evaluation  07/03/18    PT Start Time  0945    PT Stop Time  1034    PT Time Calculation (min)  49 min       Past Medical History:  Diagnosis Date  . Gout     No past surgical history on file.  There were no vitals filed for this visit.  Subjective Assessment - 06/27/18 1009    Subjective  Pt feels that PT is helping and is 40% better    Pertinent History  Gout.    Patient Stated Goals  Get out of pain.    Currently in Pain?  Yes    Pain Score  3     Pain Location  Back    Pain Orientation  Left;Right;Mid    Pain Descriptors / Indicators  Tightness;Discomfort    Pain Type  Chronic pain    Pain Onset  More than a month ago    Pain Frequency  Intermittent                       [facility] Adult PT Treatment/Exercise - 06/27/18 0001      Exercises   Exercises  Shoulder      Shoulder Exercises: Standing   Extension  Strengthening;20 reps;Limitations   3x 10-15 reps   Extension Limitations  pink XTS    Row  Strengthening;Both;20 reps;Limitations   4x 10-15 reps     Shoulder Exercises: ROM/Strengthening   UBE (Upper Arm Bike)  120 RPM x8 minutes 4 fwd, 4 bwd      Modalities   Modalities  Moist Heat;Electrical Stimulation;Ultrasound      Moist Heat Therapy   Number Minutes Moist Heat  15 Minutes    Moist Heat Location  --   mid low back     Electrical Stimulation   Electrical Stimulation Location  Lower thoracic/upper lumbar.  IFC x 15 mins at80-150hz     Electrical Stimulation Goals  Pain      Ultrasound   Ultrasound Location  Bil  thoracolumbar paras in sitting    Ultrasound Parameters  1.5 w/cm2 x 10 mins    Ultrasound Goals  Pain                  PT Long Term Goals - 06/22/18 1025      PT LONG TERM GOAL #1   Title  Independent with a HEP.    Period  Weeks    Status  Achieved      PT LONG TERM GOAL #2   Title  Stand 30 minutes with pain not > 2/10.    Time  6    Period  Weeks    Status  On-going   5/10 at this time 06/22/18     PT LONG TERM GOAL #3   Title  Perform ADL's with pain not > 2/10.    Time  6    Period  Weeks    Status  On-going   5/10 at this time 06/22/18  Plan - 06/27/18 1040    Clinical Impression Statement  Pt arrived today doing better by 40% since starting PT. He did well with core and postural exs today without increased pain and decreased tone noted over thoracolumbar paras.  Normal modality response today.    Clinical Presentation  Evolving    Rehab Potential  Excellent    PT Frequency  2x / week    PT Treatment/Interventions  ADLs/Self Care Home Management;Cryotherapy;Electrical Stimulation;Ultrasound;Moist Heat;Therapeutic activities;Therapeutic exercise;Patient/family education;Manual techniques    PT Next Visit Plan  3 visits left    Consulted and Agree with Plan of Care  Patient       Patient will benefit from skilled therapeutic intervention in order to improve the following deficits and impairments:  Pain, Decreased range of motion, Decreased activity tolerance, Postural dysfunction  Visit Diagnosis: Pain in thoracic spine  Chronic bilateral low back pain without sciatica  Abnormal posture     Problem List Patient Active Problem List   Diagnosis Date Noted  . Morbid obesity (HCC) 05/25/2016  . Tobacco abuse 05/25/2016    RAMSEUR,CHRIS, PTA 06/27/2018, 11:38 AM  [facility] [address] [place], [place], [address] Phone: [phone]   Fax:  [phone]  Name: Leonard Rivera MRN:  [id number] Date of Birth: 1981/01/22

## 2018-06-29 ENCOUNTER — Ambulatory Visit: Payer: Medicare Other | Admitting: Physical Therapy

## 2018-06-29 ENCOUNTER — Encounter: Payer: Self-pay | Admitting: Physical Therapy

## 2018-06-29 DIAGNOSIS — M546 Pain in thoracic spine: Secondary | ICD-10-CM

## 2018-06-29 DIAGNOSIS — G8929 Other chronic pain: Secondary | ICD-10-CM

## 2018-06-29 DIAGNOSIS — M545 Low back pain, unspecified: Secondary | ICD-10-CM

## 2018-06-29 DIAGNOSIS — R293 Abnormal posture: Secondary | ICD-10-CM

## 2018-06-29 NOTE — Therapy (Signed)
[facility] [address] [place], [place], [address] Phone: [phone]   Fax:  [phone]  Physical Therapy Treatment Progress Note Reporting Period 05/22/2018 to 06/29/2018  See note below for Objective Data and Assessment of Progress/Goals.   Patient has been progressing with therapeutic exercises and has reported a decrease in pain. Goals are ongoing at this time.   Patient Details  Name: Leonard Rivera MRN: [id number] Date of Birth: 06-03-1980 Referring Provider (PT): Gilford Silvius FNP.   Encounter Date: 06/29/2018  PT End of Session - 06/29/18 1007    Visit Number  10    Number of Visits  12    Date for PT Re-Evaluation  07/03/18    PT Start Time  0945    PT Stop Time  1033    PT Time Calculation (min)  48 min    Activity Tolerance  Patient tolerated treatment well    Behavior During Therapy  [facility] for tasks assessed/performed       Past Medical History:  Diagnosis Date  . Gout     History reviewed. No pertinent surgical history.  There were no vitals filed for this visit.  Subjective Assessment - 06/29/18 0946    Subjective  Patient reported feeling "good" today, some minimal discomfort and did well after last treatment    Pertinent History  Gout.    Patient Stated Goals  Get out of pain.    Currently in Pain?  Yes    Pain Score  3     Pain Location  Back    Pain Orientation  Left;Right;Mid    Pain Descriptors / Indicators  Tightness;Discomfort    Pain Type  Chronic pain    Pain Onset  More than a month ago    Pain Frequency  Intermittent    Aggravating Factors   first thing in the morning    Pain Relieving Factors  at rest                       [facility] Adult PT Treatment/Exercise - 06/29/18 0001      Shoulder Exercises: Standing   Extension  Strengthening;20 reps;Limitations;10 reps;Both    Theraband Level (Shoulder Extension)  Other (comment)    Extension Limitations  pink XTS    Row   Strengthening;Both;20 reps;Limitations;10 reps    Theraband Level (Shoulder Row)  Other (comment)    Row Limitations  pink XTS      Shoulder Exercises: ROM/Strengthening   UBE (Upper Arm Bike)  90 RPM x8 minutes 4 fwd, 4 bwd    Wall Pushups  20 reps    "W" Arms  with red swiss ball bil 2# 2x10    X to V Arms  with red swiss 2# bil 2x10      Moist Heat Therapy   Number Minutes Moist Heat  15 Minutes      Electrical Stimulation   Electrical Stimulation Location  Lower thoracic/upper lumbar.  IFC x 15 mins at80-150hz     Electrical Stimulation Goals  Pain      Ultrasound   Ultrasound Location  bil thoracolumbar    Ultrasound Parameters  1.5w/cm2/50%/69mhz x77min    Ultrasound Goals  Pain                  PT Long Term Goals - 06/29/18 1018      PT LONG TERM GOAL #1   Title  Independent with a HEP.    Time  6  Period  Weeks    Status  Achieved      PT LONG TERM GOAL #2   Title  Stand 30 minutes with pain not > 2/10.    Period  Weeks    Status  On-going   3/10 at most 06/29/18     PT LONG TERM GOAL #3   Title  Perform ADL's with pain not > 2/10.    Time  6    Period  Weeks    Status  On-going   3/10 at most 06/29/18           Plan - 06/29/18 1019    Clinical Impression Statement  Patient tolerated treatment well today with ablity to progress strengthening/stabilize back. Patient has improved overall and reported decreased discomfort with all activities no more than 3/10. Patient has decreased from 5/10 to 3/10 in a week and feels less tightness. Patient close to meeting all goals.     Rehab Potential  Excellent    PT Frequency  2x / week    PT Duration  6 weeks    PT Treatment/Interventions  ADLs/Self Care Home Management;Cryotherapy;Electrical Stimulation;Ultrasound;Moist Heat;Therapeutic activities;Therapeutic exercise;Patient/family education;Manual techniques    PT Next Visit Plan  cont with POC and DC next week to HEP    Consulted and Agree with Plan  of Care  Patient       Patient will benefit from skilled therapeutic intervention in order to improve the following deficits and impairments:  Pain, Decreased range of motion, Decreased activity tolerance, Postural dysfunction  Visit Diagnosis: Pain in thoracic spine  Chronic bilateral low back pain without sciatica  Abnormal posture     Problem List Patient Active Problem List   Diagnosis Date Noted  . Morbid obesity (HCC) 05/25/2016  . Tobacco abuse 05/25/2016    Cathie Hoops, PTA 06/29/18 10:41 AM  [facility] [address] [place], [place], [address] Phone: [phone]   Fax:  [phone]  Name: Maanav Blinn MRN: [id number] Date of Birth: 1981-01-09

## 2018-07-04 ENCOUNTER — Ambulatory Visit: Payer: Medicare Other | Attending: Family Medicine | Admitting: *Deleted

## 2018-07-04 DIAGNOSIS — M545 Low back pain, unspecified: Secondary | ICD-10-CM

## 2018-07-04 DIAGNOSIS — R293 Abnormal posture: Secondary | ICD-10-CM

## 2018-07-04 DIAGNOSIS — M546 Pain in thoracic spine: Secondary | ICD-10-CM | POA: Diagnosis not present

## 2018-07-04 DIAGNOSIS — G8929 Other chronic pain: Secondary | ICD-10-CM | POA: Diagnosis not present

## 2018-07-04 NOTE — Therapy (Signed)
[facility] [address] [place], [place], [address] Phone: [phone]   Fax:  [phone]  Physical Therapy Treatment  Patient Details  Name: Leonard Rivera MRN: [id number] Date of Birth: 1980/10/04 Referring Provider (PT): Gilford Silvius FNP.   Encounter Date: 07/04/2018  PT End of Session - 07/04/18 0947    Visit Number  11    Number of Visits  12    Date for PT Re-Evaluation  07/03/18    PT Start Time  0945    PT Stop Time  1035    PT Time Calculation (min)  50 min       Past Medical History:  Diagnosis Date  . Gout     No past surgical history on file.  There were no vitals filed for this visit.  Subjective Assessment - 07/04/18 0948    Subjective  Patient reported feeling "good" today, 3/10 pain today    Pertinent History  Gout.    Patient Stated Goals  Get out of pain.    Currently in Pain?  Yes    Pain Score  3     Pain Location  Back    Pain Orientation  Right;Left;Mid    Pain Type  Chronic pain    Pain Onset  More than a month ago    Pain Frequency  Intermittent                       [facility] Adult PT Treatment/Exercise - 07/04/18 0001      Exercises   Exercises  Lumbar      Shoulder Exercises: Standing   Extension  Strengthening;20 reps;Limitations   3x 10-15 reps   Extension Limitations  pink XTS    Row  Strengthening;Both;20 reps;Limitations   4x 10-15 reps   Row Limitations  pink XTS      Shoulder Exercises: ROM/Strengthening   UBE (Upper Arm Bike)  90 RPM x8 minutes 4 fwd, 4 bwd      Modalities   Modalities  Moist Heat;Electrical Stimulation;Ultrasound      Moist Heat Therapy   Number Minutes Moist Heat  15 Minutes    Moist Heat Location  --   mid low back     Electrical Stimulation   Electrical Stimulation Location  Lower thoracic/upper lumbar.  IFC x 15 mins at80-150hz     Electrical Stimulation Goals  Pain      Ultrasound   Ultrasound Location  Bil thoracolumbar paras    Ultrasound Parameters  1.5 w/cm2 x 10 mins    Ultrasound Goals  Pain                  PT Long Term Goals - 06/29/18 1018      PT LONG TERM GOAL #1   Title  Independent with a HEP.    Time  6    Period  Weeks    Status  Achieved      PT LONG TERM GOAL #2   Title  Stand 30 minutes with pain not > 2/10.    Period  Weeks    Status  On-going   3/10 at most 06/29/18     PT LONG TERM GOAL #3   Title  Perform ADL's with pain not > 2/10.    Time  6    Period  Weeks    Status  On-going   3/10 at most 06/29/18           Plan -  07/04/18 1118    Clinical Impression Statement  Pt arrived today still doing better with decreased pain now in his back. He was able to complete all therex without complaints of increased pain. Normal modality response today. DC after next visit.    Rehab Potential  Excellent    PT Frequency  2x / week    PT Duration  6 weeks    PT Treatment/Interventions  ADLs/Self Care Home Management;Cryotherapy;Electrical Stimulation;Ultrasound;Moist Heat;Therapeutic activities;Therapeutic exercise;Patient/family education;Manual techniques    PT Next Visit Plan   DC next week to HEP       Patient will benefit from skilled therapeutic intervention in order to improve the following deficits and impairments:  Pain, Decreased range of motion, Decreased activity tolerance, Postural dysfunction  Visit Diagnosis: Pain in thoracic spine  Chronic bilateral low back pain without sciatica  Abnormal posture     Problem List Patient Active Problem List   Diagnosis Date Noted  . Morbid obesity (HCC) 05/25/2016  . Tobacco abuse 05/25/2016    RAMSEUR,CHRIS, PTA 07/04/2018, 11:23 AM  [facility] [address] [place], [place], [address] Phone: [phone]   Fax:  [phone]  Name: Kolbee Thielen MRN: [id number] Date of Birth: 1980-08-22

## 2018-07-06 ENCOUNTER — Ambulatory Visit: Payer: Medicare Other | Admitting: *Deleted

## 2018-07-06 DIAGNOSIS — G8929 Other chronic pain: Secondary | ICD-10-CM

## 2018-07-06 DIAGNOSIS — M546 Pain in thoracic spine: Secondary | ICD-10-CM

## 2018-07-06 DIAGNOSIS — R293 Abnormal posture: Secondary | ICD-10-CM | POA: Diagnosis not present

## 2018-07-06 DIAGNOSIS — M545 Low back pain: Secondary | ICD-10-CM | POA: Diagnosis not present

## 2018-07-06 NOTE — Therapy (Signed)
[facility] [place], [place], [address] Phone: [phone]   Fax:  [phone]  Physical Therapy Treatment  Patient Details  Name: Leonard Rivera MRN: [id number] Date of Birth: Dec 28, 1980 Referring Provider (PT): Darla Lesches FNP.   Encounter Date: 07/06/2018  PT End of Session - 07/06/18 0954    Visit Number  12    Number of Visits  12    Date for PT Re-Evaluation  07/03/18    PT Start Time  0945    PT Stop Time  0092    PT Time Calculation (min)  50 min       Past Medical History:  Diagnosis Date  . Gout     No past surgical history on file.  There were no vitals filed for this visit.  Subjective Assessment - 07/06/18 0953    Subjective  Patient reported feeling "good" today, 3/10 pain again today. DC today    Pertinent History  Gout.    Patient Stated Goals  Get out of pain.    Currently in Pain?  Yes    Pain Score  3     Pain Orientation  Right    Pain Descriptors / Indicators  Discomfort;Tightness    Pain Type  Chronic pain    Pain Onset  More than a month ago    Pain Frequency  Intermittent                       [facility] Adult PT Treatment/Exercise - 07/06/18 0001      Shoulder Exercises: Standing   Other Standing Exercises  HEP green Tband Rows and shldr extension 3x10 each      Shoulder Exercises: ROM/Strengthening   UBE (Upper Arm Bike)  90 RPM x10 minutes 4 fwd, 4 bwd      Modalities   Modalities  Moist Heat;Electrical Stimulation;Ultrasound      Moist Heat Therapy   Number Minutes Moist Heat  15 Minutes    Moist Heat Location  --   mid low back     Electrical Stimulation   Electrical Stimulation Location  Lower thoracic/upper lumbar.  IFC x 15 mins at80-'150hz'     Electrical Stimulation Goals  Pain      Ultrasound   Ultrasound Location  Bil thoracolumbar    Ultrasound Parameters  1.5 w/cm2 x 10 mins    Ultrasound Goals  Pain                  PT Long Term Goals  - 07/06/18 0954      PT LONG TERM GOAL #1   Title  Independent with a HEP.    Period  Weeks    Status  Achieved      PT LONG TERM GOAL #2   Title  Stand 30 minutes with pain not > 2/10.    Time  6    Period  Weeks    Status  Achieved      PT LONG TERM GOAL #3   Title  Perform ADL's with pain not > 2/10.    Time  6    Period  Weeks    Status  Achieved              Patient will benefit from skilled therapeutic intervention in order to improve the following deficits and impairments:     Visit Diagnosis: Pain in thoracic spine  Chronic bilateral low back pain without sciatica  Abnormal posture  Problem List Patient Active Problem List   Diagnosis Date Noted  . Morbid obesity ([place]) 05/25/2016  . Tobacco abuse 05/25/2016    RAMSEUR,CHRIS, PTA 07/06/2018, 11:30 AM  [facility] [place], [place], [address] Phone: [phone]   Fax:  [phone]  Name: Deklan Minar MRN: [id number] Date of Birth: 1980/09/11  PHYSICAL THERAPY DISCHARGE SUMMARY  Visits from Start of Care: 12.  Current functional level related to goals / functional outcomes: See above.   Remaining deficits: All goals met.   Education / Equipment: HEP. Plan: Patient agrees to discharge.  Patient goals were met. Patient is being discharged due to meeting the stated rehab goals.  ?????         Mali Applegate MPT

## 2018-07-13 DIAGNOSIS — M1000 Idiopathic gout, unspecified site: Secondary | ICD-10-CM | POA: Diagnosis not present

## 2018-10-25 ENCOUNTER — Encounter (HOSPITAL_COMMUNITY): Payer: Self-pay | Admitting: Emergency Medicine

## 2018-10-25 ENCOUNTER — Emergency Department (HOSPITAL_COMMUNITY): Payer: Medicare Other

## 2018-10-25 ENCOUNTER — Other Ambulatory Visit: Payer: Self-pay

## 2018-10-25 ENCOUNTER — Emergency Department (HOSPITAL_COMMUNITY)
Admission: EM | Admit: 2018-10-25 | Discharge: 2018-10-25 | Disposition: A | Discharge status: Home / Self Care | Payer: Medicare Other | Attending: Emergency Medicine | Admitting: Emergency Medicine

## 2018-10-25 DIAGNOSIS — R51 Headache: Secondary | ICD-10-CM | POA: Diagnosis not present

## 2018-10-25 DIAGNOSIS — Z87891 Personal history of nicotine dependence: Secondary | ICD-10-CM | POA: Insufficient documentation

## 2018-10-25 DIAGNOSIS — S0101XA Laceration without foreign body of scalp, initial encounter: Secondary | ICD-10-CM | POA: Insufficient documentation

## 2018-10-25 DIAGNOSIS — M542 Cervicalgia: Secondary | ICD-10-CM | POA: Diagnosis not present

## 2018-10-25 DIAGNOSIS — Y93I9 Activity, other involving external motion: Secondary | ICD-10-CM | POA: Insufficient documentation

## 2018-10-25 DIAGNOSIS — S199XXA Unspecified injury of neck, initial encounter: Secondary | ICD-10-CM | POA: Diagnosis not present

## 2018-10-25 DIAGNOSIS — Y92410 Unspecified street and highway as the place of occurrence of the external cause: Secondary | ICD-10-CM | POA: Insufficient documentation

## 2018-10-25 DIAGNOSIS — Y999 Unspecified external cause status: Secondary | ICD-10-CM | POA: Insufficient documentation

## 2018-10-25 DIAGNOSIS — I10 Essential (primary) hypertension: Secondary | ICD-10-CM | POA: Diagnosis not present

## 2018-10-25 DIAGNOSIS — R220 Localized swelling, mass and lump, head: Secondary | ICD-10-CM | POA: Diagnosis not present

## 2018-10-25 DIAGNOSIS — S0990XA Unspecified injury of head, initial encounter: Secondary | ICD-10-CM | POA: Diagnosis not present

## 2018-10-25 DIAGNOSIS — Z79899 Other long term (current) drug therapy: Secondary | ICD-10-CM | POA: Diagnosis not present

## 2018-10-25 LAB — CBG MONITORING, ED: Glucose-Capillary: 113 mg/dL — ABNORMAL HIGH (ref 70–99)

## 2018-10-25 MED ORDER — ONDANSETRON HCL 4 MG PO TABS
4.0000 mg | ORAL_TABLET | Freq: Once | ORAL | Status: AC
  Administered 2018-10-25: 4 mg via ORAL
  Filled 2018-10-25: qty 1

## 2018-10-25 MED ORDER — CYCLOBENZAPRINE HCL 10 MG PO TABS: 10.0000 mg | ORAL_TABLET | Freq: Three times a day (TID) | ORAL | 0 refills | Status: DC

## 2018-10-25 MED ORDER — HYDROGEN PEROXIDE 3 % EX SOLN
CUTANEOUS | Status: AC
Start: 2018-10-25 — End: 2018-10-25
  Administered 2018-10-25: 1
  Filled 2018-10-25: qty 473

## 2018-10-25 MED ORDER — HYDROCODONE-ACETAMINOPHEN 5-325 MG PO TABS
2.0000 | ORAL_TABLET | Freq: Once | ORAL | Status: AC
  Administered 2018-10-25: 2 via ORAL
  Filled 2018-10-25: qty 2
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
## 2018-10-25 NOTE — Discharge Instructions (Addendum)
Your examination is negative for any acute neurologic or vascular related problems.  The CT scans of your head and neck are all negative.  You have a laceration of your scalp, but it is not 1 that needs stitches or staples.  Please apply Neosporin to the area daily.  Cleanse the area daily with soap and water gently.   You can expect to be sore over the next few days. Use extra strength Tylenol 1000 mg with breakfast, lunch, dinner, and at bedtime.  Use Flexeril 3 times daily as needed for spasm pain. This medication may cause drowsiness. Please do not drink, drive, or participate in activity that requires concentration while taking this medication.  Please return to the emergency department if any unusual headache, vision changes, difficulty with speech, difficulty with using extremities, worsening of your symptoms, problems, or concerns.
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
## 2018-10-26 ENCOUNTER — Telehealth: Payer: Self-pay | Admitting: Family Medicine

## 2018-10-26 NOTE — Telephone Encounter (Signed)
Pt requesting appt for check up appt scheduled

## 2018-11-07 ENCOUNTER — Other Ambulatory Visit: Payer: Self-pay

## 2018-11-08 ENCOUNTER — Ambulatory Visit (INDEPENDENT_AMBULATORY_CARE_PROVIDER_SITE_OTHER): Payer: Medicare Other | Admitting: Family Medicine

## 2018-11-08 ENCOUNTER — Encounter: Payer: Self-pay | Admitting: Family Medicine

## 2018-11-08 VITALS — BP 138/81 | HR 83 | Temp 97.6°F | Ht 68.0 in | Wt 347.8 lb

## 2018-11-08 DIAGNOSIS — Z131 Encounter for screening for diabetes mellitus: Secondary | ICD-10-CM

## 2018-11-08 DIAGNOSIS — I10 Essential (primary) hypertension: Secondary | ICD-10-CM | POA: Diagnosis not present

## 2018-11-08 DIAGNOSIS — Z720 Tobacco use: Secondary | ICD-10-CM | POA: Diagnosis not present

## 2018-11-08 DIAGNOSIS — I152 Hypertension secondary to endocrine disorders: Secondary | ICD-10-CM | POA: Insufficient documentation

## 2018-11-08 MED ORDER — NICOTINE 7 MG/24HR TD PT24: 7.0000 mg | MEDICATED_PATCH | Freq: Every day | TRANSDERMAL | 0 refills | Status: DC

## 2018-11-08 NOTE — Progress Notes (Signed)
BP 138/81   Pulse 83   Temp 97.6 F (36.4 C) (Oral)   Ht _0  (1.727 m)   Wt (!) 347 lb 12.8 oz (157.8 kg)   BMI 52.88 kg/m    Subjective:   Patient ID: Leonard Rivera, male    DOB: 1980/12/08, 38 y.o.   MRN: [id number]  HPI: Leonard Rivera is a 38 y.o. male presenting on 11/08/2018 for Hypertension (3 month follow up) and Diabetes (Patient would like to get checked for diabetes)   HPI Hypertension Patient is currently on no medication currently, and their blood pressure today is 138/81. Patient denies any lightheadedness or dizziness. Patient denies headaches, blurred vision, chest pains, shortness of breath, or weakness. Denies any side effects from medication and is content with current medication.  He has been is concerned because he has been told that his blood pressures been up more recently and he wanted to get this looked into.  Patient wants to discuss weight management and lifestyle changes and also wants to discuss smoking and tobacco cessation, will try nicotine patches and will refer to nutrition and dietary counseling.  Patient is on disability from a young age, I believe this is due to some mental handicap although I do not have anything in his diagnosis and he does not know if it is the exact diagnosis  Relevant past medical, surgical, family and social history reviewed and updated as indicated. Interim medical history since our last visit reviewed. Allergies and medications reviewed and updated.  Review of Systems  Constitutional: Negative for chills and fever.  Eyes: Negative for discharge.  Respiratory: Negative for shortness of breath and wheezing.   Cardiovascular: Negative for chest pain and leg swelling.  Musculoskeletal: Negative for back pain and gait problem.  Skin: Negative for rash.  Neurological: Negative for dizziness, weakness and numbness.  All other systems reviewed and are negative.   Per HPI unless specifically indicated above   Allergies as  of 11/08/2018   No Known Allergies     Medication List       Accurate as of November 08, 2018 11:58 AM. If you have any questions, ask your nurse or doctor.        cyclobenzaprine 10 MG tablet Commonly known as: FLEXERIL Take 1 tablet (10 mg total) by mouth 3 (three) times daily.   nicotine 7 mg/24hr patch Commonly known as: NICODERM CQ - dosed in mg/24 hr Place 1 patch (7 mg total) onto the skin daily. Started by: Fransisca Kaufmann Kiira Brach, MD        Objective:   BP 138/81   Pulse 83   Temp 97.6 F (36.4 C) (Oral)   Ht _1  (1.727 m)   Wt (!) 347 lb 12.8 oz (157.8 kg)   BMI 52.88 kg/m   Wt Readings from Last 3 Encounters:  11/08/18 (!) 347 lb 12.8 oz (157.8 kg)  10/25/18 (!) 320 lb (145.2 kg)  05/15/18 (!) 344 lb (156 kg)    Physical Exam Vitals signs and nursing note reviewed.  Constitutional:      General: He is not in acute distress.    Appearance: He is well-developed. He is not diaphoretic.  Eyes:     General: No scleral icterus.       Right eye: No discharge.     Conjunctiva/sclera: Conjunctivae normal.     Pupils: Pupils are equal, round, and reactive to light.  Neck:     Musculoskeletal: Neck supple.     Thyroid:  No thyromegaly.  Cardiovascular:     Rate and Rhythm: Normal rate and regular rhythm.     Heart sounds: Normal heart sounds. No murmur.  Pulmonary:     Effort: Pulmonary effort is normal. No respiratory distress.     Breath sounds: Normal breath sounds. No wheezing.  Musculoskeletal: Normal range of motion.  Lymphadenopathy:     Cervical: No cervical adenopathy.  Skin:    General: Skin is warm and dry.     Findings: No rash.  Neurological:     Mental Status: He is alert and oriented to person, place, and time.     Coordination: Coordination normal.  Psychiatric:        Behavior: Behavior normal.      Assessment & Plan:   Problem List Items Addressed This Visit      Cardiovascular and Mediastinum   Hypertension - Primary   Relevant  Orders   CBC with Differential/Platelet   CMP14+EGFR   Lipid panel   Amb ref to Medical Nutrition Therapy-MNT     Other   Morbid obesity ([place])   Relevant Orders   CBC with Differential/Platelet   CMP14+EGFR   Lipid panel   Amb ref to Medical Nutrition Therapy-MNT   TSH   Tobacco abuse   Relevant Medications   nicotine (NICODERM CQ - DOSED IN MG/24 HR) 7 mg/24hr patch    Other Visit Diagnoses    Diabetes mellitus screening       Relevant Orders   CBC with Differential/Platelet   CMP14+EGFR   Lipid panel    Will monitor blood pressure for now, will send to nutrition for education and will try and do dietary and lifestyle modification  Patient smokes a cigar or 2 a day and would like to quit and will do nicotine patches at 7 mg because he is only smoking 1 or 2 a day.  Follow up plan: Return in about 3 months (around 02/08/2019), or if symptoms worsen or fail to improve, for Blood pressure recheck.  Counseling provided for all of the vaccine components Orders Placed This Encounter  Procedures  . CBC with Differential/Platelet  . CMP14+EGFR  . Lipid panel  . TSH  . Amb ref to [place], MD [facility] 11/08/2018, 11:58 AM

## 2018-11-09 LAB — CMP14+EGFR
ALT: 40 IU/L (ref 0–44)
AST: 24 IU/L (ref 0–40)
Albumin/Globulin Ratio: 1.8 (ref 1.2–2.2)
Albumin: 4.2 g/dL (ref 4.0–5.0)
Alkaline Phosphatase: 96 IU/L (ref 39–117)
BUN/Creatinine Ratio: 11 (ref 9–20)
BUN: 11 mg/dL (ref 6–20)
Bilirubin Total: 0.2 mg/dL (ref 0.0–1.2)
CO2: 24 mmol/L (ref 20–29)
Calcium: 8.9 mg/dL (ref 8.7–10.2)
Chloride: 100 mmol/L (ref 96–106)
Creatinine, Ser: 1.03 mg/dL (ref 0.76–1.27)
GFR calc Af Amer: 107 mL/min/{1.73_m2} (ref >59)
GFR calc non Af Amer: 92 mL/min/{1.73_m2} (ref >59)
Globulin, Total: 2.3 g/dL (ref 1.5–4.5)
Glucose: 102 mg/dL — ABNORMAL HIGH (ref 65–99)
Potassium: 4.6 mmol/L (ref 3.5–5.2)
Sodium: 138 mmol/L (ref 134–144)
Total Protein: 6.5 g/dL (ref 6.0–8.5)

## 2018-11-09 LAB — CBC WITH DIFFERENTIAL/PLATELET
Basophils Absolute: 0.1 10*3/uL (ref 0.0–0.2)
Basos: 1 %
EOS (ABSOLUTE): 0.4 10*3/uL (ref 0.0–0.4)
Eos: 4 %
Hematocrit: 45.9 % (ref 37.5–51.0)
Hemoglobin: 15.2 g/dL (ref 13.0–17.7)
Immature Grans (Abs): 0 10*3/uL (ref 0.0–0.1)
Immature Granulocytes: 0 %
Lymphocytes Absolute: 3.1 10*3/uL (ref 0.7–3.1)
Lymphs: 31 %
MCH: 28.8 pg (ref 26.6–33.0)
MCHC: 33.1 g/dL (ref 31.5–35.7)
MCV: 87 fL (ref 79–97)
Monocytes Absolute: 0.8 10*3/uL (ref 0.1–0.9)
Monocytes: 8 %
Neutrophils Absolute: 5.4 10*3/uL (ref 1.4–7.0)
Neutrophils: 56 %
Platelets: 329 10*3/uL (ref 150–450)
RBC: 5.27 x10E6/uL (ref 4.14–5.80)
RDW: 13.8 % (ref 11.6–15.4)
WBC: 9.7 10*3/uL (ref 3.4–10.8)

## 2018-11-09 LAB — LIPID PANEL
Chol/HDL Ratio: 3.7 ratio (ref 0.0–5.0)
Cholesterol, Total: 166 mg/dL (ref 100–199)
HDL: 45 mg/dL (ref >39)
LDL Calculated: 92 mg/dL (ref 0–99)
Triglycerides: 144 mg/dL (ref 0–149)
VLDL Cholesterol Cal: 29 mg/dL (ref 5–40)

## 2018-11-09 LAB — TSH: TSH: 1.55 u[IU]/mL (ref 0.450–4.500)

## 2018-11-28 ENCOUNTER — Ambulatory Visit: Payer: Medicare Other

## 2018-12-06 ENCOUNTER — Ambulatory Visit (INDEPENDENT_AMBULATORY_CARE_PROVIDER_SITE_OTHER): Payer: Medicare Other | Admitting: *Deleted

## 2018-12-06 ENCOUNTER — Other Ambulatory Visit: Payer: Self-pay

## 2018-12-06 VITALS — Ht 68.0 in | Wt 347.9 lb

## 2018-12-06 DIAGNOSIS — Z0000 Encounter for general adult medical examination without abnormal findings: Secondary | ICD-10-CM | POA: Diagnosis not present

## 2018-12-06 NOTE — Patient Instructions (Signed)
[name] , Thank you for taking time to come for your Medicare Wellness Visit. I appreciate your ongoing commitment to your health goals. Please review the following plan we discussed and let me know if I can assist you in the future.   These are the goals we discussed: Goals     Exercise 3x per week (30 min per time)     Try to exercise for at least 30 minutes, 3 times weekly        This is a list of the screening recommended for you and due dates:  Health Maintenance  Topic Date Due   HIV Screening  05/11/1996   Flu Shot  12/02/2018   Tetanus Vaccine  12/31/2025    Advance Directive  Advance directives are legal documents that let you make choices ahead of time about your health care and medical treatment in case you become unable to communicate for yourself. Advance directives are a way for you to communicate your wishes to family, friends, and health care providers. This can help convey your decisions about end-of-life care if you become unable to communicate. Discussing and writing advance directives should happen over time rather than all at once. Advance directives can be changed depending on your situation and what you want, even after you have signed the advance directives. If you do not have an advance directive, some states assign family decision makers to act on your behalf based on how closely you are related to them. Each state has its own laws regarding advance directives. You may want to check with your health care provider, attorney, or state representative about the laws in your state. There are different types of advance directives, such as:  Medical power of attorney.  Living will.  Do not resuscitate (DNR) or do not attempt resuscitation (DNAR) order. Health care proxy and medical power of attorney A health care proxy, also called a health care agent, is a person who is appointed to make medical decisions for you in cases in which you are unable to make the  decisions yourself. Generally, people choose someone they know well and trust to represent their preferences. Make sure to ask this person for an agreement to act as your proxy. A proxy may have to exercise judgment in the event of a medical decision for which your wishes are not known. A medical power of attorney is a legal document that names your health care proxy. Depending on the laws in your state, after the document is written, it may also need to be:  Signed.  Notarized.  Dated.  Copied.  Witnessed.  Incorporated into your medical record. You may also want to appoint someone to manage your financial affairs in a situation in which you are unable to do so. This is called a durable power of attorney for finances. It is a separate legal document from the durable power of attorney for health care. You may choose the same person or someone different from your health care proxy to act as your agent in financial matters. If you do not appoint a proxy, or if there is a concern that the proxy is not acting in your best interests, a court-appointed guardian may be designated to act on your behalf. Living will A living will is a set of instructions documenting your wishes about medical care when you cannot express them yourself. Health care providers should keep a copy of your living will in your medical record. You may want to give a  copy to family members or friends. To alert caregivers in case of an emergency, you can place a card in your wallet to let them know that you have a living will and where they can find it. A living will is used if you become:  Terminally ill.  Incapacitated.  Unable to communicate or make decisions. Items to consider in your living will include:  The use or non-use of life-sustaining equipment, such as dialysis machines and breathing machines (ventilators).  A DNR or DNAR order, which is the instruction not to use cardiopulmonary resuscitation (CPR) if  breathing or heartbeat stops.  The use or non-use of tube feeding.  Withholding of food and fluids.  Comfort (palliative) care when the goal becomes comfort rather than a cure.  Organ and tissue donation. A living will does not give instructions for distributing your money and property if you should pass away. It is recommended that you seek the advice of a lawyer when writing a will. Decisions about taxes, beneficiaries, and asset distribution will be legally binding. This process can relieve your family and friends of any concerns surrounding disputes or questions that may come up about the distribution of your assets. DNR or DNAR A DNR or DNAR order is a request not to have CPR in the event that your heart stops beating or you stop breathing. If a DNR or DNAR order has not been made and shared, a health care provider will try to help any patient whose heart has stopped or who has stopped breathing. If you plan to have surgery, talk with your health care provider about how your DNR or DNAR order will be followed if problems occur. Summary  Advance directives are the legal documents that allow you to make choices ahead of time about your health care and medical treatment in case you become unable to communicate for yourself.  The process of discussing and writing advance directives should happen over time. You can change the advance directives, even after you have signed them.  Advance directives include DNR or DNAR orders, living wills, and designating an agent as your medical power of attorney. This information is not intended to replace advice given to you by your health care provider. Make sure you discuss any questions you have with your health care provider. Document Released: 07/27/2007 Document Revised: 05/24/2018 Document Reviewed: 03/08/2016 Elsevier Patient Education  2020 Elsevier Inc.   BMI for Adults  Body mass index (BMI) is a number that is calculated from a person's  weight and height. BMI may help to estimate how much of a person's weight is composed of fat. BMI can help identify those who may be at higher risk for certain medical problems. How is BMI used with adults? BMI is used as a screening tool to identify possible weight problems. It is used to check whether a person is obese, overweight, healthy weight, or underweight. How is BMI calculated? BMI measures your weight and compares it to your height. This can be done either in [place] ([place] or metric measurements. Note that charts are available to help you find your BMI quickly and easily without having to do these calculations yourself. To calculate your BMI in English ([place] measurements, your health care provider will: 1. Measure your weight in pounds (lb). 2. Multiply the number of pounds by 703. ? For example, for a person who weighs 180 lb, multiply that number by 703, which equals 126,540. 3. Measure your height in inches (in). Then multiply that number  by itself to get a measurement called "inches squared." ? For example, for a person who is 70 in tall, the "inches squared" measurement is 70 in x 70 in, which equals 4900 inches squared. 4. Divide the total from Step 2 (number of lb x 703) by the total from Step 3 (inches squared): 126,540  4900 = 25.8. This is your BMI. To calculate your BMI in metric measurements, your health care provider will: 1. Measure your weight in kilograms (kg). 2. Measure your height in meters (m). Then multiply that number by itself to get a measurement called "meters squared." ? For example, for a person who is 1.75 m tall, the "meters squared" measurement is 1.75 m x 1.75 m, which is equal to 3.1 meters squared. 3. Divide the number of kilograms (your weight) by the meters squared number. In this example: 70  3.1 = 22.6. This is your BMI. How is BMI interpreted? To interpret your results, your health care provider will use BMI charts to identify whether you are  underweight, normal weight, overweight, or obese. The following guidelines will be used:  Underweight: BMI less than 18.5.  Normal weight: BMI between 18.5 and 24.9.  Overweight: BMI between 25 and 29.9.  Obese: BMI of 30 and above. Please note:  Weight includes both fat and muscle, so someone with a muscular build, such as an athlete, may have a BMI that is higher than 24.9. In cases like these, BMI is not an accurate measure of body fat.  To determine if excess body fat is the cause of a BMI of 25 or higher, further assessments may need to be done by a health care provider.  BMI is usually interpreted in the same way for men and women. Why is BMI a useful tool? BMI is useful in two ways:  Identifying a weight problem that may be related to a medical condition, or that may increase the risk for medical problems.  Promoting lifestyle and diet changes in order to reach a healthy weight. Summary  Body mass index (BMI) is a number that is calculated from a person's weight and height.  BMI may help to estimate how much of a person's weight is composed of fat. BMI can help identify those who may be at higher risk for certain medical problems.  BMI can be measured using English measurements or metric measurements.  To interpret your results, your health care provider will use BMI charts to identify whether you are underweight, normal weight, overweight, or obese. This information is not intended to replace advice given to you by your health care provider. Make sure you discuss any questions you have with your health care provider. Document Released: 12/30/2003 Document Revised: 04/01/2017 Document Reviewed: 03/02/2017 Elsevier Patient Education  2020 [employer].

## 2018-12-06 NOTE — Progress Notes (Signed)
MEDICARE ANNUAL WELLNESS VISIT  12/06/2018  Telephone Visit Disclaimer This Medicare AWV was conducted by telephone due to national recommendations for restrictions regarding the COVID-19 Pandemic (e.g. social distancing).  I verified, using two identifiers, that I am speaking with Leonard Rivera or their authorized healthcare agent. I discussed the limitations, risks, security, and privacy concerns of performing an evaluation and management service by telephone and the potential availability of an in-person appointment in the future. The patient expressed understanding and agreed to proceed.   Subjective:  Leonard Rivera is a 38 y.o. male patient of Dettinger, Fransisca Kaufmann, MD who had a Medicare Annual Wellness Visit today via telephone. Tanay is Disabled and lives with their family. he has 0 children. he reports that he is socially active and does interact with friends/family regularly. he is not physically active and enjoys watching tv.  Patient Care Team: Dettinger, Fransisca Kaufmann, MD as PCP - General (Family Medicine)  Advanced Directives 12/06/2018 10/25/2018 12/04/2017 05/04/2017 04/12/2017 12/20/2016 01/01/2016  Does Patient Have a Medical Advance Directive? No No No No No No No  Would patient like information on creating a medical advance directive? Yes (MAU/Ambulatory/Procedural Areas - Information given) No - Patient declined No - Patient declined - No - Patient declined - No - patient declined information    Hospital Utilization Over the Past 12 Months: # of hospitalizations or ER visits: 0 # of surgeries: 0  Review of Systems    Patient reports that his overall health is unchanged compared to last year.  Patient Reported Readings (BP, Pulse, CBG, Weight, etc) none  Review of Systems: History obtained from chart review and the patient General ROS: negative  All other systems negative.  Pain Assessment Pain : No/denies pain     Current Medications & Allergies (verified)  Allergies as of 12/06/2018   No Known Allergies     Medication List       Accurate as of December 06, 2018  3:56 PM. If you have any questions, ask your nurse or doctor.        cyclobenzaprine 10 MG tablet Commonly known as: FLEXERIL Take 1 tablet (10 mg total) by mouth 3 (three) times daily.   nicotine 7 mg/24hr patch Commonly known as: NICODERM CQ - dosed in mg/24 hr Place 1 patch (7 mg total) onto the skin daily.       History (reviewed): Past Medical History:  Diagnosis Date  . Gout    History reviewed. No pertinent surgical history. Family History  Problem Relation Age of Onset  . Asthma Brother    Social History   Socioeconomic History  . Marital status: Single    Spouse name: Not on file  . Number of children: Not on file  . Years of education: Not on file  . Highest education level: 12th grade  Occupational History  . Occupation: Disabled  Social Needs  . Financial resource strain: Not on file  . Food insecurity    Worry: Never true    Inability: Never true  . Transportation needs    Medical: No    Non-medical: No  Tobacco Use  . Smoking status: Former Smoker    Types: Cigars  . Smokeless tobacco: Never Used  Substance and Sexual Activity  . Alcohol use: No  . Drug use: No  . Sexual activity: Yes  Lifestyle  . Physical activity    Days per week: 0 days    Minutes per session: 0 min  . Stress:  Not on file  Relationships  . Social connections    Talks on phone: More than three times a week    Gets together: More than three times a week    Attends religious service: Never    Active member of club or organization: No    Attends meetings of clubs or organizations: Never    Relationship status: Never married  Other Topics Concern  . Not on file  Social History Narrative  . Not on file    Activities of Daily Living In your present state of health, do you have any difficulty performing the following activities: 12/06/2018  Hearing? N  Vision?  N  Difficulty concentrating or making decisions? N  Walking or climbing stairs? N  Dressing or bathing? N  Doing errands, shopping? N  Preparing Food and eating ? N  Using the Toilet? N  In the past six months, have you accidently leaked urine? N  Do you have problems with loss of bowel control? N  Managing your Medications? N  Managing your Finances? N  Housekeeping or managing your Housekeeping? N  Some recent data might be hidden    Patient Education/ Literacy How often do you need to have someone help you when you read instructions, pamphlets, or other written materials from your doctor or pharmacy?: 1 - Never What is the last grade level you completed in school?: 12th Grade  Exercise Current Exercise Habits: The patient does not participate in regular exercise at present  Diet Patient reports consuming 3 meals a day and 2 snack(s) a day Patient reports that his primary diet is: Regular Patient reports that she does have regular access to food.   Depression Screen PHQ 2/9 Scores 12/06/2018 11/08/2018 05/15/2018 05/25/2016  PHQ - 2 Score 0 0 0 0     Fall Risk Fall Risk  12/06/2018 05/25/2016  Falls in the past year? 0 No  Number falls in past yr: 0 -  Injury with Fall? 0 -     Objective:  Leonard Rivera seemed alert and oriented and he participated appropriately during our telephone visit.  Blood Pressure Weight BMI  BP Readings from Last 3 Encounters:  11/08/18 138/81  10/25/18 140/82  05/15/18 (!) 137/94   Wt Readings from Last 3 Encounters:  12/06/18 (!) 347 lb 14.2 oz (157.8 kg)  11/08/18 (!) 347 lb 12.8 oz (157.8 kg)  10/25/18 (!) 320 lb (145.2 kg)   BMI Readings from Last 1 Encounters:  12/06/18 52.90 kg/m    *Unable to obtain current vital signs, weight, and BMI due to telephone visit type  Hearing/Vision  . Trapper did not seem to have difficulty with hearing/understanding during the telephone conversation . Reports that he has not had a formal eye exam by  an eye care professional within the past year . Reports that he has not had a formal hearing evaluation within the past year *Unable to fully assess hearing and vision during telephone visit type  Cognitive Function: 6CIT Screen 12/06/2018  What Year? 0 points  What month? 0 points  What time? 0 points  Count back from 20 0 points  Months in reverse 0 points  Repeat phrase 0 points  Total Score 0   (Normal:0-7, Significant for Dysfunction: >8)  Normal Cognitive Function Screening: Yes   Immunization & Health Maintenance Record Immunization History  Administered Date(s) Administered  . DTaP 07/14/1981, 09/15/1981, 11/10/1981  . Hepatitis B 01/20/1995, 02/24/1995, 08/11/1995  . IPV 07/14/1981, 09/15/1981, 11/10/1981  . Influenza,inj,Quad  PF,6+ Mos 02/01/2016  . MMR 08/31/1982  . Tdap 01/01/2016    Health Maintenance  Topic Date Due  . HIV Screening  05/11/1996  . INFLUENZA VACCINE  12/02/2018  . TETANUS/TDAP  12/31/2025       Assessment  This is a routine wellness examination for [facility].  Health Maintenance: Due or Overdue Health Maintenance Due  Topic Date Due  . HIV Screening  05/11/1996  . INFLUENZA VACCINE  12/02/2018    Leonard Rivera does not need a referral for Community Assistance: Care Management:   no Social Work:    no Prescription Assistance:  no Nutrition/Diabetes Education:  no   Plan:  Personalized Goals Goals Addressed            This Visit's Progress   . Exercise 3x per week (30 min per time)       Try to exercise for at least 30 minutes, 3 times weekly       Personalized Health Maintenance & Screening Recommendations  Influenza vaccine Advanced directives: has NO advanced directive  - add't info requested. Referral to SW: no  Lung Cancer Screening Recommended: no (Low Dose CT Chest recommended if Age 8-80 years, 30 pack-year currently smoking OR have quit w/in past 15 years) Hepatitis C Screening recommended: no HIV  Screening recommended: yes  Advanced Directives: Written information was prepared per patient's request.  Referrals & Orders No orders of the defined types were placed in this encounter.   Follow-up Plan . Follow-up with Dettinger, Fransisca Kaufmann, MD as planned   I have personally reviewed and noted the following in the patient's chart:   . Medical and social history . Use of alcohol, tobacco or illicit drugs  . Current medications and supplements . Functional ability and status . Nutritional status . Physical activity . Advanced directives . List of other physicians . Hospitalizations, surgeries, and ER visits in previous 12 months . Vitals . Screenings to include cognitive, depression, and falls . Referrals and appointments  In addition, I have reviewed and discussed with Leonard Rivera certain preventive protocols, quality metrics, and best practice recommendations. A written personalized care plan for preventive services as well as general preventive health recommendations is available and can be mailed to the patient at his request.      Wardell Heath, LPN  10/05/7901

## 2018-12-28 ENCOUNTER — Encounter: Payer: Self-pay | Admitting: Nutrition

## 2018-12-28 ENCOUNTER — Other Ambulatory Visit: Payer: Self-pay

## 2018-12-28 ENCOUNTER — Telehealth: Payer: Self-pay | Admitting: Nutrition

## 2018-12-28 ENCOUNTER — Encounter: Payer: No Typology Code available for payment source | Attending: Family Medicine | Admitting: Nutrition

## 2018-12-28 VITALS — Ht 68.0 in | Wt 347.0 lb

## 2018-12-28 DIAGNOSIS — Z6841 Body Mass Index (BMI) 40.0 and over, adult: Secondary | ICD-10-CM | POA: Insufficient documentation

## 2018-12-28 NOTE — Patient Instructions (Signed)
Goals Follow Plate Method Cut out snacks. Drink water Lose 1-2 lbs per week.

## 2018-12-28 NOTE — Progress Notes (Signed)
  Medical Nutrition Therapy:  Appt start time: 1130 end time:  1230.   Assessment:  Primary concerns today: obesity. Lives with his grandma. She does most cooking and shopping. Fried foods mostly.  Willing to changes how foods.  Eats 2-3 meals per day. Not working. On disability. Walks a lot. Willing to make changes with diet to improve health, lower BP and lose weight.   Preferred Learning Style:     No preference indicated   Learning Readiness:   Ready  Change in progress   MEDICATIONS:   DIETARY INTAKE:  24-hr recall:  B ( AM): 2 eggs, water Snk ( AM):  nabs  L ( PM): hot dog with bun 2, ATL, water Snk ( PM):  D ( PM): skipped last night.; usually meat and vegetables, water Snk ( PM): apple Beverages:  water  Usual physical activity: walks alot  Estimated energy needs: 1800  calories 200 g carbohydrates 135 g protein 50 g fat  Progress Towards Goal(s):  In progress.   Nutritional Diagnosis:  NB-1.1 Food and nutrition-related knowledge deficit As related to Obesity.  As evidenced by Obesity.    Intervention:  Nutrition and  Pre Diabetes education provided on My Plate, CHO counting, meal planning, portion sizes, timing of meals, avoiding snacks between meals unless having a low blood sugar, target ranges for A1C and blood sugars, signs/symptoms and treatment of hyper/hypoglycemia, monitoring blood sugars, taking medications as prescribed, benefits of exercising 30 minutes per day and prevention of complications of DM.  Goals Follow Plate Method Cut out snacks. Cut out processed foods Eat meals on time Increase fresh fruits and vegetables. Drink water Lose 1-2 lbs per week.  Teaching Method Utilized:  Visual Auditory Hands on  Handouts given during visit include:  The Plate Method  Meal Plan   Barriers to learning/adherence to lifestyle change: none  Demonstrated degree of understanding via:  Teach Back   Monitoring/Evaluation:  Dietary  intake, exercise,  and body weight in 1 month(s).

## 2019-01-25 ENCOUNTER — Other Ambulatory Visit: Payer: Self-pay

## 2019-01-25 ENCOUNTER — Encounter: Payer: Medicare Other | Attending: Family Medicine | Admitting: Nutrition

## 2019-01-25 ENCOUNTER — Encounter: Payer: Self-pay | Admitting: Nutrition

## 2019-01-25 VITALS — Ht 68.0 in | Wt 326.0 lb

## 2019-01-25 DIAGNOSIS — I10 Essential (primary) hypertension: Secondary | ICD-10-CM | POA: Diagnosis not present

## 2019-01-25 DIAGNOSIS — Z713 Dietary counseling and surveillance: Secondary | ICD-10-CM | POA: Insufficient documentation

## 2019-01-25 DIAGNOSIS — Z6841 Body Mass Index (BMI) 40.0 and over, adult: Secondary | ICD-10-CM | POA: Insufficient documentation

## 2019-01-25 NOTE — Progress Notes (Signed)
  Medical Nutrition Therapy:  Appt start time: 517 end time:  930   Assessment:  Primary concerns today: obesity. Lives with his grandma. She does most cooking and shopping.  Working on walking an hour a day more and eating better. Lose 21 lbs. Doing fantastic. Feels better.  Preferred Learning Style:     No preference indicated   Learning Readiness:   Ready  Change in progress   MEDICATIONS:   DIETARY INTAKE:  24-hr recall:  B ( AM): 2 eggs, [place] bacon, Snk ( AM):  nabs  L ( PM):  Baked chicken, green beans, water Snk ( PM):  D ( PM): protein shake, water Snk ( PM):  Beverages:  water  Usual physical activity: walks alot  Estimated energy needs: 1800  calories 200 g carbohydrates 135 g protein 50 g fat  Progress Towards Goal(s):  In progress.   Nutritional Diagnosis:  NB-1.1 Food and nutrition-related knowledge deficit As related to Obesity.  As evidenced by Obesity.    Intervention:  Nutrition and  Pre Diabetes education provided on My Plate, CHO counting, meal planning, portion sizes, timing of meals, avoiding snacks between meals unless having a low blood sugar, target ranges for A1C and blood sugars, signs/symptoms and treatment of hyper/hypoglycemia, monitoring blood sugars, taking medications as prescribed, benefits of exercising 30 minutes per day and prevention of complications of DM.   Goals Keep up the great job. Increase vegetables with lunch and supper Keep walking a hour a day Add fruit with meals Add oatmeal with breakfast. Lose 1-2 lbs per week.  Teaching Method Utilized:  Visual Auditory Hands on  Handouts given during visit include:  The Plate Method  Meal Plan   Barriers to learning/adherence to lifestyle change: none  Demonstrated degree of understanding via:  Teach Back   Monitoring/Evaluation:  Dietary intake, exercise,  and body weight in 3 month(s).

## 2019-01-25 NOTE — Patient Instructions (Addendum)
Goals Keep up the great job. Increase vegetables with lunch and supper Keep walking a hour a day Add fruit with meals Add oatmeal with breakfast. Lose 1-2 lbs per week.

## 2019-02-01 NOTE — Telephone Encounter (Signed)
vm left to call and reschedule missed appt. 

## 2019-02-07 ENCOUNTER — Other Ambulatory Visit: Payer: Self-pay

## 2019-02-08 ENCOUNTER — Encounter: Payer: Self-pay | Admitting: Family Medicine

## 2019-02-08 ENCOUNTER — Ambulatory Visit (INDEPENDENT_AMBULATORY_CARE_PROVIDER_SITE_OTHER): Payer: Medicare Other | Admitting: Family Medicine

## 2019-02-08 VITALS — BP 151/82 | Wt 315.0 lb

## 2019-02-08 DIAGNOSIS — I10 Essential (primary) hypertension: Secondary | ICD-10-CM | POA: Diagnosis not present

## 2019-02-08 MED ORDER — AMLODIPINE BESYLATE 5 MG PO TABS: 5.0000 mg | ORAL_TABLET | Freq: Every day | ORAL | 3 refills | Status: DC

## 2019-02-08 NOTE — Progress Notes (Signed)
   Virtual Visit via telephone Note  I connected with Leonard Rivera on 02/08/19 at 1123 by telephone and verified that I am speaking with the correct person using two identifiers. Leonard Rivera is currently located at home and Grandmother are currently with her during visit. The provider, Fransisca Kaufmann Dettinger, MD is located in their office at time of visit.  Call ended at 51  I discussed the limitations, risks, security and privacy concerns of performing an evaluation and management service by telephone and the availability of in person appointments. I also discussed with the patient that there may be a patient responsible charge related to this service. The patient expressed understanding and agreed to proceed.   History and Present Illness: Hypertension Patient is currently on no medication, and their blood pressure today is 151/82. Patient denies any lightheadedness or dizziness. Patient denies headaches, blurred vision, chest pains, shortness of breath, or weakness. Denies any side effects from medication and is content with current medication. bp is 151/82  No diagnosis found.  Outpatient Encounter Medications as of 02/08/2019  Medication Sig  . cyclobenzaprine (FLEXERIL) 10 MG tablet Take 1 tablet (10 mg total) by mouth 3 (three) times daily.  . nicotine (NICODERM CQ - DOSED IN MG/24 HR) 7 mg/24hr patch Place 1 patch (7 mg total) onto the skin daily.   No facility-administered encounter medications on file as of 02/08/2019.     Review of Systems  Constitutional: Negative for chills and fever.  Respiratory: Negative for shortness of breath and wheezing.   Cardiovascular: Negative for chest pain and leg swelling.  Musculoskeletal: Negative for back pain and gait problem.  Skin: Negative for rash.  Neurological: Negative for dizziness, weakness and light-headedness.  All other systems reviewed and are negative.   Observations/Objective: Patient sounds comfortable and in no acute  distress  Assessment and Plan: Problem List Items Addressed This Visit      Cardiovascular and Mediastinum   Hypertension - Primary   Relevant Medications   amLODipine (NORVASC) 5 MG tablet       Follow Up Instructions: Follow up on htn in 4 weeks Started amlodipine     I discussed the assessment and treatment plan with the patient. The patient was provided an opportunity to ask questions and all were answered. The patient agreed with the plan and demonstrated an understanding of the instructions.   The patient was advised to call back or seek an in-person evaluation if the symptoms worsen or if the condition fails to improve as anticipated.  The above assessment and management plan was discussed with the patient. The patient verbalized understanding of and has agreed to the management plan. Patient is aware to call the clinic if symptoms persist or worsen. Patient is aware when to return to the clinic for a follow-up visit. Patient educated on when it is appropriate to go to the emergency department.    I provided 7 minutes of non-face-to-face time during this encounter.    Worthy Rancher, MD

## 2019-02-12 ENCOUNTER — Other Ambulatory Visit: Payer: Self-pay

## 2019-03-22 ENCOUNTER — Other Ambulatory Visit: Payer: Self-pay

## 2019-03-23 ENCOUNTER — Encounter: Payer: Self-pay | Admitting: Family Medicine

## 2019-03-23 ENCOUNTER — Ambulatory Visit (INDEPENDENT_AMBULATORY_CARE_PROVIDER_SITE_OTHER): Payer: Medicare Other | Admitting: Family Medicine

## 2019-03-23 VITALS — BP 130/77 | HR 76 | Temp 97.3°F | Ht 68.0 in | Wt 312.6 lb

## 2019-03-23 DIAGNOSIS — I10 Essential (primary) hypertension: Secondary | ICD-10-CM

## 2019-03-23 NOTE — Progress Notes (Signed)
BP 130/77   Pulse 76   Temp (!) 97.3 F (36.3 C) (Temporal)   Ht '5\' 8"'  (1.727 m)   Wt (!) 312 lb 9.6 oz (141.8 kg)   SpO2 98%   BMI 47.53 kg/m    Subjective:   Patient ID: Leonard Rivera, male    DOB: 1981/01/30, 38 y.o.   MRN: [id number]  HPI: Leonard Rivera is a 38 y.o. male presenting on 03/23/2019 for Hypertension (1 month followup)   HPI Hypertension Patient is currently on amlodipine, and their blood pressure today is 130/77. Patient denies any lightheadedness or dizziness. Patient denies headaches, blurred vision, chest pains, shortness of breath, or weakness. Denies any side effects from medication and is content with current medication.  Discussed morbid obesity and weight loss.  Patient is being somewhat active but not as active as he normally is.  Relevant past medical, surgical, family and social history reviewed and updated as indicated. Interim medical history since our last visit reviewed. Allergies and medications reviewed and updated.  Review of Systems  Constitutional: Negative for chills and fever.  Respiratory: Negative for shortness of breath and wheezing.   Cardiovascular: Negative for chest pain and leg swelling.  Musculoskeletal: Negative for back pain and gait problem.  Skin: Negative for rash.  Neurological: Negative for dizziness, weakness, light-headedness and numbness.  All other systems reviewed and are negative.   Per HPI unless specifically indicated above   Allergies as of 03/23/2019   No Known Allergies     Medication List       Accurate as of March 23, 2019  1:24 PM. If you have any questions, ask your nurse or doctor.        amLODipine 5 MG tablet Commonly known as: NORVASC Take 1 tablet (5 mg total) by mouth daily.   cyclobenzaprine 10 MG tablet Commonly known as: FLEXERIL Take 1 tablet (10 mg total) by mouth 3 (three) times daily.   nicotine 7 mg/24hr patch Commonly known as: NICODERM CQ - dosed in mg/24 hr Place 1  patch (7 mg total) onto the skin daily.        Objective:   BP 130/77   Pulse 76   Temp (!) 97.3 F (36.3 C) (Temporal)   Ht '5\' 8"'  (1.727 m)   Wt (!) 312 lb 9.6 oz (141.8 kg)   SpO2 98%   BMI 47.53 kg/m   Wt Readings from Last 3 Encounters:  03/23/19 (!) 312 lb 9.6 oz (141.8 kg)  02/08/19 (!) 315 lb (142.9 kg)  01/25/19 (!) 326 lb (147.9 kg)    Physical Exam Vitals signs and nursing note reviewed.  Constitutional:      General: He is not in acute distress.    Appearance: He is well-developed. He is not diaphoretic.  Eyes:     General: No scleral icterus.    Conjunctiva/sclera: Conjunctivae normal.  Neck:     Musculoskeletal: Neck supple.     Thyroid: No thyromegaly.  Cardiovascular:     Rate and Rhythm: Normal rate and regular rhythm.     Heart sounds: Normal heart sounds. No murmur.  Pulmonary:     Effort: Pulmonary effort is normal. No respiratory distress.     Breath sounds: Normal breath sounds. No wheezing.  Musculoskeletal: Normal range of motion.  Lymphadenopathy:     Cervical: No cervical adenopathy.  Skin:    General: Skin is warm and dry.     Findings: No rash.  Neurological:  Mental Status: He is alert and oriented to person, place, and time.     Coordination: Coordination normal.  Psychiatric:        Behavior: Behavior normal.       Assessment & Plan:   Problem List Items Addressed This Visit      Cardiovascular and Mediastinum   Hypertension - Primary   Relevant Orders   BMP8+EGFR     Other   Morbid obesity ([place])      Continue amlodipine, his blood pressure looks good, continue with smoking cessation, sounds like he is doing a lot better. Follow up plan: Return in about 6 months (around 09/20/2019), or if symptoms worsen or fail to improve, for Hypertension.  Counseling provided for all of the vaccine components Orders Placed This Encounter  Procedures  . BMP8+EGFR    Caryl Pina, MD [facility]  03/23/2019, 1:24 PM

## 2019-03-24 LAB — BMP8+EGFR
BUN/Creatinine Ratio: 8 — ABNORMAL LOW (ref 9–20)
BUN: 7 mg/dL (ref 6–20)
CO2: 22 mmol/L (ref 20–29)
Calcium: 8.9 mg/dL (ref 8.7–10.2)
Chloride: 104 mmol/L (ref 96–106)
Creatinine, Ser: 0.91 mg/dL (ref 0.76–1.27)
GFR calc Af Amer: 124 mL/min/{1.73_m2} (ref >59)
GFR calc non Af Amer: 107 mL/min/{1.73_m2} (ref >59)
Glucose: 102 mg/dL — ABNORMAL HIGH (ref 65–99)
Potassium: 4.4 mmol/L (ref 3.5–5.2)
Sodium: 141 mmol/L (ref 134–144)

## 2019-05-02 ENCOUNTER — Encounter: Payer: Medicare Other | Attending: Family Medicine | Admitting: Nutrition

## 2019-05-02 ENCOUNTER — Other Ambulatory Visit: Payer: Self-pay

## 2019-05-02 ENCOUNTER — Encounter: Payer: Self-pay | Admitting: Nutrition

## 2019-05-02 VITALS — Ht 68.0 in | Wt 304.6 lb

## 2019-05-02 DIAGNOSIS — I10 Essential (primary) hypertension: Secondary | ICD-10-CM | POA: Insufficient documentation

## 2019-05-02 NOTE — Patient Instructions (Signed)
Goals Keep up the great job. Lose 14 lbs in the next 3 months. Increase vegetables with lunch and supper Keep walking a hour a day Add fruit with meals Add oatmeal with breakfast. Lose 1-2 lbs per week.

## 2019-05-02 NOTE — Progress Notes (Signed)
  Medical Nutrition Therapy:  Appt start time: 941 end time:  930   Assessment:  Primary concerns today: obesity. Lives with his grandma. She does most cooking and shopping. He has cut out fast food and junk food. He notes he isn't having any issues with GOUT anymore since losing weight. 22 lbs lost in the last 3 months..He notes his BP is much better now and he can move arouind a lot better. Has been eating baked and broiled foods and more fresh fruits and vegetables. Cut out fast foods and fried foods. Working on walking an hour a day more and eating better.  Doing fantastic. Feels better. Sleeping better.    CMP Latest Ref Rng & Units 03/23/2019 11/08/2018 05/25/2016  Glucose 65 - 99 mg/dL 102(H) 102(H) 97  BUN 6 - 20 mg/dL 7 11 12   Creatinine 0.76 - 1.27 mg/dL 0.91 1.03 0.85  Sodium 134 - 144 mmol/L 141 138 142  Potassium 3.5 - 5.2 mmol/L 4.4 4.6 4.5  Chloride 96 - 106 mmol/L 104 100 101  CO2 20 - 29 mmol/L 22 24 23   Calcium 8.7 - 10.2 mg/dL 8.9 8.9 9.2  Total Protein 6.0 - 8.5 g/dL - 6.5 6.5  Total Bilirubin 0.0 - 1.2 mg/dL - 0.2 0.4  Alkaline Phos 39 - 117 IU/L - 96 93  AST 0 - 40 IU/L - 24 22  ALT 0 - 44 IU/L - 40 36   Lipid Panel     Component Value Date/Time   CHOL 166 11/08/2018 1201   TRIG 144 11/08/2018 1201   HDL 45 11/08/2018 1201   CHOLHDL 3.7 11/08/2018 1201   LDLCALC 92 11/08/2018 1201   LABVLDL 29 11/08/2018 1201    Preferred Learning Style:    No preference indicated   Learning Readiness:   Ready  Change in progress   MEDICATIONS:   DIETARY INTAKE:  24-hr recall:  B ( AM):Boiled egg and apple, water Snk ( AM):   L ( PM):  [place] sandwich on ww bread, applesauce, water Snk ( PM):  D ( PM): meat and vegetables, water,fruit Snk ( PM):  Beverages:  water  Usual physical activity: walks alot  Estimated energy needs: 1800  calories 200 g carbohydrates 135 g protein 50 g fat  Progress Towards Goal(s):  In progress.   Nutritional  Diagnosis:  NB-1.1 Food and nutrition-related knowledge deficit As related to Obesity.  As evidenced by Obesity.    Intervention:  Nutrition and  Pre Diabetes education provided on My Plate, CHO counting, meal planning, portion sizes, timing of meals, avoiding snacks between meals unless having a low blood sugar, target ranges for A1C and blood sugars, signs/symptoms and treatment of hyper/hypoglycemia, monitoring blood sugars, taking medications as prescribed, benefits of exercising 30 minutes per day and prevention of complications of DM.   Goals Keep up the great job. Lose 14 lbs in the next 3 months. Increase vegetables with lunch and supper Keep walking a hour a day Add fruit with meals Add oatmeal with breakfast. Lose 1-2 lbs per week.  Teaching Method Utilized:  Visual Auditory Hands on  Handouts given during visit include:  The Plate Method  Meal Plan   Barriers to learning/adherence to lifestyle change: none  Demonstrated degree of understanding via:  Teach Back   Monitoring/Evaluation:  Dietary intake, exercise,  and body weight in 3 month(s).

## 2019-07-10 ENCOUNTER — Telehealth: Payer: Self-pay | Admitting: Family Medicine

## 2019-07-10 NOTE — Chronic Care Management (AMB) (Signed)
  Chronic Care Management   Note  07/10/2019 Name: Shyhiem Beeney MRN: [id number] DOB: [birth year]  Tysin Salada is a 39 y.o. year old male who is a primary care patient of Dettinger, Fransisca Kaufmann, MD. I reached out to [name] by phone today in response to a referral sent by Mr. K[facility] health plan.     Mr. Geske was given information about Chronic Care Management services today including:  1. CCM service includes personalized support from designated clinical staff supervised by his physician, including individualized plan of care and coordination with other care providers 2. 24/7 contact phone numbers for assistance for urgent and routine care needs. 3. Service will only be billed when office clinical staff spend 20 minutes or more in a month to coordinate care. 4. Only one practitioner may furnish and bill the service in a calendar month. 5. The patient may stop CCM services at any time (effective at the end of the month) by phone call to the office staff. 6. The patient will be responsible for cost sharing (co-pay) of up to 20% of the service fee (after annual deductible is met).  Patient agreed to services and verbal consent obtained.   Follow up plan: Telephone appointment with care management team member scheduled for: 09/12/2019.  [place], [place] [address] Direct Dial: [phone] [email or web address]

## 2019-07-31 ENCOUNTER — Encounter: Payer: Medicare Other | Attending: Family Medicine | Admitting: Nutrition

## 2019-07-31 ENCOUNTER — Other Ambulatory Visit: Payer: Self-pay

## 2019-07-31 ENCOUNTER — Encounter: Payer: Self-pay | Admitting: Nutrition

## 2019-07-31 DIAGNOSIS — Z713 Dietary counseling and surveillance: Secondary | ICD-10-CM | POA: Diagnosis not present

## 2019-07-31 DIAGNOSIS — I10 Essential (primary) hypertension: Secondary | ICD-10-CM | POA: Diagnosis not present

## 2019-07-31 DIAGNOSIS — Z6841 Body Mass Index (BMI) 40.0 and over, adult: Secondary | ICD-10-CM | POA: Insufficient documentation

## 2019-07-31 NOTE — Progress Notes (Signed)
  Medical Nutrition Therapy:  Appt start time: 915 end time:  930   Assessment:  Primary concerns today: obesity. Lives with his grandma.  Has lost 53 lbs in 8 months. Walking an hour daily. Feels excellent. Eating much better. Cut out all junk food and processed foods. Eating more fresh fruits and vegetables. Wants to start trying to jog a little while walking. He checks his BP at home and it's much better-120/130's over 70/80's. His last CMet and Lipid profile WNL. Next wellness visit in August 2021.   Vitals with BMI 07/31/2019 05/02/2019 03/23/2019  Height 5\' 8"  5\' 8"  5\' 8"   Weight 294 lbs 304 lbs 10 oz 312 lbs 10 oz  BMI 44.71 46.33 47.54  Systolic   130  Diastolic   77  Pulse   76   Preferred Learning Style:     No preference indicated   Learning Readiness:   Ready  Change in progress   MEDICATIONS:   DIETARY INTAKE:  24-hr recall:  B ( AM): egg, 1 slice toast and bacon and apple, water Snk ( AM):  L ( PM):  Grilled chicken salad sandwich, apple and water Snk ( PM):  D ( PM):  Broccoli, hot dog with bun, water, Snk ( PM):  Beverages:  water  Usual physical activity: walks alot  Estimated energy needs: 1800  calories 200 g carbohydrates 135 g protein 50 g fat  Progress Towards Goal(s):  In progress.   Nutritional Diagnosis:  NB-1.1 Food and nutrition-related knowledge deficit As related to Obesity.  As evidenced by Obesity.    Intervention:  Nutrition and  Pre Diabetes education provided on My Plate, CHO counting, meal planning, portion sizes, timing of meals, avoiding snacks between meals unless having a low blood sugar, target ranges for A1C and blood sugars, signs/symptoms and treatment of hyper/hypoglycemia, monitoring blood sugars, taking medications as prescribed, benefits of exercising 30 minutes per day and prevention of complications of DM.   Goals Keep up the great job!!! Start jogging a little bit when walking Increase lower carb  vegetables with lunch and dinner Increase high fiber foods Lose 2-3 per month.  Teaching Method Utilized:  Visual Auditory Hands on  Handouts given during visit include:  The Plate Method  Meal Plan   Barriers to learning/adherence to lifestyle change: none  Demonstrated degree of understanding via:  Teach Back   Monitoring/Evaluation:  Dietary intake, exercise,  and body weight in 6 month(s).

## 2019-07-31 NOTE — Patient Instructions (Signed)
°

## 2019-08-10 ENCOUNTER — Ambulatory Visit: Payer: Medicare Other | Attending: Internal Medicine

## 2019-08-10 DIAGNOSIS — Z23 Encounter for immunization: Secondary | ICD-10-CM

## 2019-08-10 NOTE — Progress Notes (Signed)
   Covid-19 Vaccination Clinic  Name:  Leonard Rivera    MRN: [id number] DOB: 01/05/1981  08/10/2019  Mr. [name] was observed post Covid-19 immunization for 15 minutes without incident. He was provided with Vaccine Information Sheet and instruction to access the V-Safe system.   Mr. [name] was instructed to call 911 with any severe reactions post vaccine: Marland Kitchen Difficulty breathing  . Swelling of face and throat  . A fast heartbeat  . A bad rash all over body  . Dizziness and weakness   Immunizations Administered    Name Date Dose VIS Date Route   Moderna COVID-19 Vaccine 08/10/2019  8:57 AM 0.5 mL 04/03/2019 Intramuscular   Manufacturer: Moderna   Lot: [id number]   NDC: 72072-182-88

## 2019-09-04 DIAGNOSIS — M25571 Pain in right ankle and joints of right foot: Secondary | ICD-10-CM | POA: Diagnosis not present

## 2019-09-04 DIAGNOSIS — M7989 Other specified soft tissue disorders: Secondary | ICD-10-CM | POA: Diagnosis not present

## 2019-09-12 ENCOUNTER — Ambulatory Visit: Payer: Medicare Other

## 2019-09-12 ENCOUNTER — Ambulatory Visit: Payer: Medicare Other | Admitting: *Deleted

## 2019-09-12 DIAGNOSIS — I10 Essential (primary) hypertension: Secondary | ICD-10-CM

## 2019-09-12 NOTE — Chronic Care Management (AMB) (Signed)
  Chronic Care Management   Initial Visit Note  09/12/2019 Name: Leonard Rivera MRN: [id number] DOB: 04/09/1981  Referred by: Dettinger, Elige Radon, MD Reason for referral : Chronic Care Management (Initial Visit)   Stefon Ramthun is a 38 y.o. year old male who is a primary care patient of Dettinger, Elige Radon, MD. The CCM team was consulted for assistance with chronic disease management and care coordination needs related to HTN  Subjective: Because Mr Drees only has one qualifying chronic medical condition, he does not qualify for CCM services. Care management assistance with resources can be provided as needed. I spoke with Mr Volante by telephone today. He lives at home with his grandmother. He is actively connected with nutrionist Norm Salt and is working with her to improve his diet and exercise regimen. He does not have any problems with access to food, transportation, or adequate housing.   SDOH (Social Determinants of Health) assessments performed: Yes See Care Plan activities for detailed interventions related to SDOH     Objective: Outpatient Encounter Medications as of 09/12/2019  Medication Sig  . amLODipine (NORVASC) 5 MG tablet Take 1 tablet (5 mg total) by mouth daily.  . cyclobenzaprine (FLEXERIL) 10 MG tablet Take 1 tablet (10 mg total) by mouth 3 (three) times daily.  . nicotine (NICODERM CQ - DOSED IN MG/24 HR) 7 mg/24hr patch Place 1 patch (7 mg total) onto the skin daily.   No facility-administered encounter medications on file as of 09/12/2019.    BP Readings from Last 3 Encounters:  03/23/19 130/77  02/08/19 (!) 151/82  11/08/18 138/81    Assessment & Plan:  Hypertension: Followed by Dr Dettinger and compliant with medications  Morbid Obesity: Working with nutritionist and is exercising regularly  No further follow up required: Patient is not eligible for CCM services but has been advised that CCM team is available to assist him in the future if the need  arises  Demetrios Loll, BSN, RN-BC Embedded Chronic Care Manager [facility] Care Management Direct Dial: [phone]

## 2019-09-13 ENCOUNTER — Ambulatory Visit: Payer: Medicare Other | Attending: Internal Medicine

## 2019-09-13 DIAGNOSIS — Z23 Encounter for immunization: Secondary | ICD-10-CM

## 2019-09-13 NOTE — Progress Notes (Signed)
   Covid-19 Vaccination Clinic  Name:  Leonard Rivera    MRN: [id number] DOB: 09-Apr-1981  09/13/2019  [name] was observed post Covid-19 immunization for 15 minutes without incident. He was provided with Vaccine Information Sheet and instruction to access the V-Safe system.   [name] was instructed to call 911 with any severe reactions post vaccine: Marland Kitchen Difficulty breathing  . Swelling of face and throat  . A fast heartbeat  . A bad rash all over body  . Dizziness and weakness   Immunizations Administered    Name Date Dose VIS Date Route   Moderna COVID-19 Vaccine 09/13/2019 11:47 AM 0.5 mL 04/2019 Intramuscular   Manufacturer: Moderna   Lot: [id number]   NDC: 26712-458-09

## 2019-12-17 ENCOUNTER — Ambulatory Visit (INDEPENDENT_AMBULATORY_CARE_PROVIDER_SITE_OTHER): Payer: Medicare Other

## 2019-12-17 DIAGNOSIS — Z0000 Encounter for general adult medical examination without abnormal findings: Secondary | ICD-10-CM

## 2019-12-17 NOTE — Patient Instructions (Signed)
  [facility] Maintenance Summary and Written Plan of Care  Mr. Mazzaferro ,  Thank you for allowing me to perform your Medicare Annual Wellness Visit and for your ongoing commitment to your health.   Health Maintenance & Immunization History Health Maintenance  Topic Date Due  . Hepatitis C Screening  Never done  . INFLUENZA VACCINE  12/02/2019  . HIV Screening  03/22/2020 (Originally 05/11/1996)  . TETANUS/TDAP  12/31/2025  . COVID-19 Vaccine  Completed   Immunization History  Administered Date(s) Administered  . DTaP 07/14/1981, 09/15/1981, 11/10/1981  . Hepatitis B 01/20/1995, 02/24/1995, 08/11/1995  . IPV 07/14/1981, 09/15/1981, 11/10/1981  . Influenza,inj,Quad PF,6+ Mos 02/01/2016  . MMR 08/31/1982  . Moderna SARS-COVID-2 Vaccination 08/10/2019, 09/13/2019  . Tdap 01/01/2016    These are the patient goals that we discussed: Goals Addressed   None      This is a list of Health Maintenance Items that are overdue or due now: Health Maintenance Due  Topic Date Due  . Hepatitis C Screening  Never done  . INFLUENZA VACCINE  12/02/2019     Orders/Referrals Placed Today: No orders of the defined types were placed in this encounter.  (Contact our referral department at [phone] if you have not spoken with someone about your referral appointment within the next 5 days)    Follow-up Plan  Scheduled with Dr. Warrick Parisian 02/06/20 at 10:40am.

## 2019-12-17 NOTE — Progress Notes (Signed)
MEDICARE ANNUAL WELLNESS VISIT  12/17/2019  Telephone Visit Disclaimer This Medicare AWV was conducted by telephone due to national recommendations for restrictions regarding the COVID-19 Pandemic (e.g. social distancing).  I verified, using two identifiers, that I am speaking with Leonard Rivera or their authorized healthcare agent. I discussed the limitations, risks, security, and privacy concerns of performing an evaluation and management service by telephone and the potential availability of an in-person appointment in the future. The patient expressed understanding and agreed to proceed.   Subjective:  Leonard Rivera is a 39 y.o. male patient of Dettinger, Fransisca Kaufmann, MD who had a Medicare Annual Wellness Visit today via telephone. Leonard Rivera is Disabled and {lives with:[id number]::"lives with his grandmonther. he has no children. he reports that he is socially active and does interact with friends/family regularly. he is minimally physically active.  Patient Care Team: Dettinger, Fransisca Kaufmann, MD as PCP - General (Family Medicine)  Advanced Directives 12/17/2019 12/06/2018 10/25/2018 12/04/2017 05/04/2017 04/12/2017 12/20/2016  Does Patient Have a Medical Advance Directive? Yes No No No No No No  Type of Advance Directive [place]  Does patient want to make changes to medical advance directive? No - Patient declined - - - - - -  Would patient like information on creating a medical advance directive? No - Patient declined Yes (MAU/Ambulatory/Procedural Areas - Information given) No - Patient declined No - Patient declined - No - Patient declined -    Hospital Utilization Over the Past 12 Months: # of hospitalizations or ER visits: 0 # of surgeries: 0  Review of Systems    Patient reports that his overall health is unchanged compared to last year.  Patient Reported Readings (BP, Pulse, CBG, Weight, etc) none  Pain Assessment Pain : No/denies pain     Current  Medications & Allergies (verified) Allergies as of 12/17/2019   No Known Allergies     Medication List       Accurate as of December 17, 2019 11:33 AM. If you have any questions, ask your nurse or doctor.        STOP taking these medications   nicotine 7 mg/24hr patch Commonly known as: NICODERM CQ - dosed in mg/24 hr     TAKE these medications   amLODipine 5 MG tablet Commonly known as: NORVASC Take 1 tablet (5 mg total) by mouth daily.   cyclobenzaprine 10 MG tablet Commonly known as: FLEXERIL Take 1 tablet (10 mg total) by mouth 3 (three) times daily.       History (reviewed): Past Medical History:  Diagnosis Date  . Gout   . Hypertension   . Morbid obesity ([place])    No past surgical history on file. Family History  Problem Relation Age of Onset  . Asthma Brother    Social History   Socioeconomic History  . Marital status: Single    Spouse name: Not on file  . Number of children: Not on file  . Years of education: Not on file  . Highest education level: 12th grade  Occupational History  . Occupation: Disabled  Tobacco Use  . Smoking status: Former Smoker    Types: Cigars  . Smokeless tobacco: Never Used  Vaping Use  . Vaping Use: Never used  Substance and Sexual Activity  . Alcohol use: No  . Drug use: No  . Sexual activity: Yes  Other Topics Concern  . Not on file  Social History Narrative  .  Not on file   Social Determinants of Health   Financial Resource Strain: Low Risk   . Difficulty of Paying Living Expenses: Not very hard  Food Insecurity: No Food Insecurity  . Worried About Charity fundraiser in the Last Year: Never true  . Ran Out of Food in the Last Year: Never true  Transportation Needs: No Transportation Needs  . Lack of Transportation (Medical): No  . Lack of Transportation (Non-Medical): No  Physical Activity: Sufficiently Active  . Days of Exercise per Week: 7 days  . Minutes of Exercise per Session: 60 min  Stress:   .  Feeling of Stress :   Social Connections:   . Frequency of Communication with Friends and Family:   . Frequency of Social Gatherings with Friends and Family:   . Attends Religious Services:   . Active Member of Clubs or Organizations:   . Attends Archivist Meetings:   Marland Kitchen Marital Status:     Activities of Daily Living In your present state of health, do you have any difficulty performing the following activities: 12/17/2019 09/12/2019  Hearing? N N  Vision? N N  Difficulty concentrating or making decisions? N N  Walking or climbing stairs? N N  Dressing or bathing? N N  Doing errands, shopping? N N  Preparing Food and eating ? N N  Using the Toilet? N -  In the past six months, have you accidently leaked urine? N -  Do you have problems with loss of bowel control? N -  Managing your Medications? N -  Managing your Finances? N -  Housekeeping or managing your Housekeeping? N -  Some recent data might be hidden    Patient Education/ Literacy How often do you need to have someone help you when you read instructions, pamphlets, or other written materials from your doctor or pharmacy?: 2 - Rarely What is the last grade level you completed in school?: 12th grade  Exercise Current Exercise Habits: Home exercise routine, Time (Minutes): 60, Frequency (Times/Week): 7, Weekly Exercise (Minutes/Week): 420  Diet Patient reports consuming 3 meals a day and 0 snack(s) a day Patient reports that his primary diet is: Regular Patient reports that she does have regular access to food.   Depression Screen PHQ 2/9 Scores 12/17/2019 05/02/2019 03/23/2019 12/28/2018 12/06/2018 11/08/2018 05/15/2018  PHQ - 2 Score 0 0 0 0 0 0 0     Fall Risk Fall Risk  12/17/2019 05/02/2019 12/28/2018 12/06/2018 05/25/2016  Falls in the past year? 0 0 0 0 No  Number falls in past yr: - 0 0 0 -  Injury with Fall? - 0 0 0 -     Objective:  Leonard Rivera seemed alert and oriented and he participated  appropriately during our telephone visit.  Blood Pressure Weight BMI  BP Readings from Last 3 Encounters:  03/23/19 130/77  02/08/19 (!) 151/82  11/08/18 138/81   Wt Readings from Last 3 Encounters:  07/31/19 294 lb (133.4 kg)  05/02/19 (!) 304 lb 9.6 oz (138.2 kg)  03/23/19 (!) 312 lb 9.6 oz (141.8 kg)   BMI Readings from Last 1 Encounters:  07/31/19 44.70 kg/m    *Unable to obtain current vital signs, weight, and BMI due to telephone visit type  Hearing/Vision  . Luiz did not seem to have difficulty with hearing/understanding during the telephone conversation . Reports that he has not had a formal eye exam by an eye care professional within the past year . Reports  that he has not had a formal hearing evaluation within the past year *Unable to fully assess hearing and vision during telephone visit type  Cognitive Function: 6CIT Screen 12/17/2019 12/06/2018  What Year? 0 points 0 points  What month? 0 points 0 points  What time? 0 points 0 points  Count back from 20 0 points 0 points  Months in reverse 2 points 0 points  Repeat phrase 0 points 0 points  Total Score 2 0   (Normal:0-7, Significant for Dysfunction: >8)  Normal Cognitive Function Screening: Yes   Immunization & Health Maintenance Record Immunization History  Administered Date(s) Administered  . DTaP 07/14/1981, 09/15/1981, 11/10/1981  . Hepatitis B 01/20/1995, 02/24/1995, 08/11/1995  . IPV 07/14/1981, 09/15/1981, 11/10/1981  . Influenza,inj,Quad PF,6+ Mos 02/01/2016  . MMR 08/31/1982  . Moderna SARS-COVID-2 Vaccination 08/10/2019, 09/13/2019  . Tdap 01/01/2016    Health Maintenance  Topic Date Due  . Hepatitis C Screening  Never done  . INFLUENZA VACCINE  12/02/2019  . HIV Screening  03/22/2020 (Originally 05/11/1996)  . TETANUS/TDAP  12/31/2025  . COVID-19 Vaccine  Completed       Assessment  This is a routine wellness examination for [facility].  Health Maintenance: Due or Overdue Health  Maintenance Due  Topic Date Due  . Hepatitis C Screening  Never done  . INFLUENZA VACCINE  12/02/2019    Leonard Rivera does not need a referral for Community Assistance: Care Management:   no Social Work:    no Prescription Assistance:  no Nutrition/Diabetes Education:  no   Plan:  Personalized Goals Goals Addressed            This Visit's Progress   . Exercise 3x per week (30 min per time)   On track    Try to exercise for at least 30 minutes, 3 times weekly       Personalized Health Maintenance & Screening Recommendations   Lung Cancer Screening Recommended: no (Low Dose CT Chest recommended if Age 36-80 years, 30 pack-year currently smoking OR have quit w/in past 15 years) Hepatitis C Screening recommended: no HIV Screening recommended: no  Advanced Directives: Written information was not prepared per patient's request.  Referrals & Orders No orders of the defined types were placed in this encounter.   Follow-up Plan . Follow-up with Dettinger, Fransisca Kaufmann, MD as planned . Schedule 02/06/2020   I have personally reviewed and noted the following in the patient's chart:   . Medical and social history . Use of alcohol, tobacco or illicit drugs  . Current medications and supplements . Functional ability and status . Nutritional status . Physical activity . Advanced directives . List of other physicians . Hospitalizations, surgeries, and ER visits in previous 12 months . Vitals . Screenings to include cognitive, depression, and falls . Referrals and appointments  In addition, I have reviewed and discussed with Leonard Rivera certain preventive protocols, quality metrics, and best practice recommendations. A written personalized care plan for preventive services as well as general preventive health recommendations is available and can be mailed to the patient at his request.      Maud Deed [facility]  08/16/6061

## 2019-12-19 DIAGNOSIS — H5712 Ocular pain, left eye: Secondary | ICD-10-CM | POA: Diagnosis not present

## 2019-12-19 DIAGNOSIS — H5789 Other specified disorders of eye and adnexa: Secondary | ICD-10-CM | POA: Diagnosis not present

## 2019-12-19 DIAGNOSIS — H04202 Unspecified epiphora, left lacrimal gland: Secondary | ICD-10-CM | POA: Diagnosis not present

## 2019-12-19 DIAGNOSIS — S0502XA Injury of conjunctiva and corneal abrasion without foreign body, left eye, initial encounter: Secondary | ICD-10-CM | POA: Diagnosis not present

## 2020-01-29 ENCOUNTER — Telehealth: Payer: Self-pay | Admitting: Family Medicine

## 2020-01-29 DIAGNOSIS — R7303 Prediabetes: Secondary | ICD-10-CM

## 2020-01-29 NOTE — Telephone Encounter (Signed)
REFERRAL REQUEST Telephone Note  Have you been seen at our office for this problem? Yes (Advise that they may need an appointment with their PCP before a referral can be done)  Reason for Referral: Melissa from [facility] called and stated Patient's current Ref has expired and they need a new one placed so patient can continue to be seen. Referral discussed with patient: Yes Best contact number of patient for referral team:    Has patient been seen by a specialist for this issue before: Yes Patient provider preference for referral: [facility] Patient location preference for referral: [place]    Patient notified that referrals can take up to a week or longer to process. If they haven't heard anything within a week they should call back and speak with the referral department.

## 2020-01-30 NOTE — Telephone Encounter (Signed)
Placed referral order for the patient

## 2020-02-05 ENCOUNTER — Encounter: Payer: Self-pay | Admitting: Nutrition

## 2020-02-05 ENCOUNTER — Encounter (HOSPITAL_COMMUNITY): Payer: Self-pay

## 2020-02-05 ENCOUNTER — Emergency Department (HOSPITAL_COMMUNITY)
Admission: EM | Admit: 2020-02-05 | Discharge: 2020-02-05 | Disposition: A | Discharge status: Home / Self Care | Payer: Medicare Other | Attending: Emergency Medicine | Admitting: Emergency Medicine

## 2020-02-05 ENCOUNTER — Other Ambulatory Visit: Payer: Self-pay

## 2020-02-05 ENCOUNTER — Encounter: Payer: Medicare Other | Attending: Family Medicine | Admitting: Nutrition

## 2020-02-05 VITALS — Ht 68.0 in | Wt 292.0 lb

## 2020-02-05 DIAGNOSIS — R7303 Prediabetes: Secondary | ICD-10-CM | POA: Insufficient documentation

## 2020-02-05 DIAGNOSIS — M79642 Pain in left hand: Secondary | ICD-10-CM | POA: Insufficient documentation

## 2020-02-05 DIAGNOSIS — Z87891 Personal history of nicotine dependence: Secondary | ICD-10-CM | POA: Insufficient documentation

## 2020-02-05 DIAGNOSIS — I10 Essential (primary) hypertension: Secondary | ICD-10-CM | POA: Diagnosis not present

## 2020-02-05 MED ORDER — ALLOPURINOL 100 MG PO TABS: 100.0000 mg | ORAL_TABLET | Freq: Every day | ORAL | 0 refills | Status: DC

## 2020-02-05 MED ORDER — ALLOPURINOL 100 MG PO TABS
100.0000 mg | ORAL_TABLET | Freq: Once | ORAL | Status: AC
  Administered 2020-02-05: 10:00:00 100 mg via ORAL
  Filled 2020-02-05: qty 1

## 2020-02-05 MED ORDER — COLCHICINE 0.6 MG PO TABS
1.2000 mg | ORAL_TABLET | Freq: Once | ORAL | Status: AC
  Administered 2020-02-05: 10:00:00 1.2 mg via ORAL
  Filled 2020-02-05: qty 2

## 2020-02-05 NOTE — Patient Instructions (Signed)
°  Goals Keep up the great job!!! Start jogging a little bit when walking Increase lower carb vegetables with lunch and dinner Increase high fiber foods Lose 2-3 per month.

## 2020-02-05 NOTE — Discharge Instructions (Signed)
As discussed you have been diagnosed with hand pain.  This is likely due to gout.  It is important to follow-up with your physician in 1 week to ensure that your symptoms have improved.  Return here for concerning changes in your condition.

## 2020-02-05 NOTE — ED Provider Notes (Signed)
[facility] EMERGENCY DEPARTMENT Provider Note   CSN: [id number] Arrival date & time: 02/05/20  0825     History Chief Complaint  Patient presents with  . Hand Pain    Leonard Rivera is a 39 y.o. male.  HPI    Patient presents with concern of left hand pain.  Onset is atraumatic, he notes that he awoke this morning with pain in the dorsum of his hand/wrist.  It is worse with motion.  Pain is sore, moderate. There is no new fever, nausea, vomiting, other complaints. He has taken no medication for relief per Pain is worse with palpation as well. Patient has a history of gout, does not take medication regularly.  Past Medical History:  Diagnosis Date  . Gout   . Hypertension   . Morbid obesity [facility])     Patient Active Problem List   Diagnosis Date Noted  . Hypertension 11/08/2018  . Morbid obesity ([facility]) 05/25/2016  . Tobacco abuse 05/25/2016    History reviewed. No pertinent surgical history.     Family History  Problem Relation Age of Onset  . Asthma Brother     Social History   Tobacco Use  . Smoking status: Former Smoker    Types: Cigars  . Smokeless tobacco: Never Used  Vaping Use  . Vaping Use: Never used  Substance Use Topics  . Alcohol use: No  . Drug use: No    Home Medications Prior to Admission medications   Medication Sig Start Date End Date Taking? Authorizing Provider  allopurinol (ZYLOPRIM) 100 MG tablet Take 1 tablet (100 mg total) by mouth daily. 02/05/20   Gerhard Munch, MD  amLODipine (NORVASC) 5 MG tablet Take 1 tablet (5 mg total) by mouth daily. 02/08/19   Dettinger, Elige Radon, MD  cyclobenzaprine (FLEXERIL) 10 MG tablet Take 1 tablet (10 mg total) by mouth 3 (three) times daily. 10/25/18   Ivery Quale, PA-C    Allergies    Patient has no known allergies.  Review of Systems   Review of Systems  Constitutional:       Per HPI, otherwise negative  HENT:       Per HPI, otherwise negative  Respiratory:       Per HPI,  otherwise negative  Cardiovascular:       Per HPI, otherwise negative  Gastrointestinal: Negative for vomiting.  Endocrine:       Negative aside from HPI  Genitourinary:       Neg aside from HPI   Musculoskeletal:       Per HPI, otherwise negative  Skin: Negative.   Neurological: Negative for syncope.  Hematological:       Gout, per HPI    Physical Exam Updated Vital Signs BP (!) 147/96 (BP Location: Right Arm)   Pulse 75   Temp 97.7 F (36.5 C) (Oral)   Resp 20   Ht 5\' 8"  (1.727 m)   Wt 132.5 kg   SpO2 98%   BMI 44.40 kg/m   Physical Exam Vitals and nursing note reviewed.  Constitutional:      General: He is not in acute distress.    Appearance: He is well-developed. He is obese. He is not ill-appearing, toxic-appearing or diaphoretic.  HENT:     Head: Normocephalic and atraumatic.  Eyes:     Conjunctiva/sclera: Conjunctivae normal.  Cardiovascular:     Rate and Rhythm: Normal rate and regular rhythm.     Pulses: Normal pulses.  Pulmonary:  Effort: Pulmonary effort is normal. No respiratory distress.     Breath sounds: No stridor.  Abdominal:     General: There is no distension.  Musculoskeletal:     Left wrist: Tenderness present. No deformity, effusion or lacerations. Decreased range of motion.     Comments: Tenderness palpation throughout the wrist, worse on the radial aspect, dorsum.  Pain with motion of the wrist, no superficial erythema, no warmth  Skin:    General: Skin is warm and dry.  Neurological:     Mental Status: He is alert and oriented to person, place, and time.     ED Results / Procedures / Treatments    Procedures Procedures (including critical care time)  Medications Ordered in ED Medications  colchicine tablet 1.2 mg (has no administration in time range)  allopurinol (ZYLOPRIM) tablet 100 mg (has no administration in time range)    ED Course  I have reviewed the triage vital signs and the nursing notes.  Pertinent labs &  imaging results that were available during my care of the patient were reviewed by me and considered in my medical decision making (see chart for details).  Obese adult male with history of gout, hypertension presents with concern for new atraumatic wrist pain. Patient has no erythema, no warmth in the joint, has no fever, no evidence for systemic processes. Little evidence for infection, given his history of gout, suspicion for recurrence Patient appropriate for, amenable to initiation of therapy here, outpatient follow-up. Final Clinical Impression(s) / ED Diagnoses Final diagnoses:  Left hand pain    Rx / DC Orders ED Discharge Orders         Ordered    allopurinol (ZYLOPRIM) 100 MG tablet  Daily        02/05/20 0919           Gerhard Munch, MD 02/05/20 [phone]

## 2020-02-05 NOTE — ED Triage Notes (Signed)
Pt presents to ED with left hand swelling, NKI

## 2020-02-05 NOTE — Progress Notes (Signed)
  Medical Nutrition Therapy:  Appt start time: 0800 end time:  0830  Assessment:  Primary concerns today: obesity and HTN. . Lives with his grandma.  Lost 2 lbs. He hurt his left wrist somehow last night and it's black and blue. He can't move it much at all and is going to the ER to get it checked out. He thinks he may have fell out of bed but he doesn't remember. Walking an hour daily. He is now using hand weights.   BP at home is usually  125/80's. Scheduled to see PCP the end of this month for blood work. Making slow and stead progress.  Wt Readings from Last 3 Encounters:  02/05/20 292 lb (132.5 kg)  02/05/20 292 lb (132.5 kg)  07/31/19 294 lb (133.4 kg)   Ht Readings from Last 3 Encounters:  02/05/20 5\' 8"  (1.727 m)  02/05/20 5\' 8"  (1.727 m)  07/31/19 5\' 8"  (1.727 m)   Body mass index is 44.4 kg/m. @BMIFA @ Facility age limit for growth percentiles is 20 years. Facility age limit for growth percentiles is 20 years.   Preferred Learning Style:     No preference indicated   Learning Readiness:   Ready  Change in progress   MEDICATIONS:   DIETARY INTAKE:  24-hr recall:  B ( AM): 2 eggs, 1 packet Oatmeal, water Snk ( AM):  L ( PM): sandwich and apple, water Snk ( PM):  D ( PM): Apple, water Snk ( PM):  Beverages:  water  Usual physical activity: walks alot  Estimated energy needs: 1800  calories 200 g carbohydrates 135 g protein 50 g fat  Progress Towards Goal(s):  In progress.   Nutritional Diagnosis:  NB-1.1 Food and nutrition-related knowledge deficit As related to Obesity.  As evidenced by Obesity.    Intervention:  Nutrition and  Pre Diabetes education provided on My Plate, CHO counting, meal planning, portion sizes, timing of meals, avoiding snacks between meals unless having a low blood sugar, target ranges for A1C and blood sugars, signs/symptoms and treatment of hyper/hypoglycemia, monitoring blood sugars, taking medications as  prescribed, benefits of exercising 30 minutes per day and prevention of complications of DM.   Goals Keep up the great job!!! Start jogging a little bit when walking Increase lower carb vegetables with lunch and dinner Increase high fiber foods Lose 2-3 per month.  Teaching Method Utilized:  Visual Auditory Hands on  Handouts given during visit include:  The Plate Method  Meal Plan   Barriers to learning/adherence to lifestyle change: none  Demonstrated degree of understanding via:  Teach Back   Monitoring/Evaluation:  Dietary intake, exercise,  and body weight in 6 month(s).

## 2020-02-06 ENCOUNTER — Ambulatory Visit: Payer: Medicare Other | Admitting: Family Medicine

## 2020-02-28 ENCOUNTER — Encounter: Payer: Self-pay | Admitting: Family Medicine

## 2020-02-28 ENCOUNTER — Other Ambulatory Visit: Payer: Self-pay

## 2020-02-28 ENCOUNTER — Ambulatory Visit (INDEPENDENT_AMBULATORY_CARE_PROVIDER_SITE_OTHER): Payer: Medicare Other | Admitting: Family Medicine

## 2020-02-28 VITALS — BP 137/80 | HR 70 | Temp 98.0°F | Ht 68.0 in | Wt 297.0 lb

## 2020-02-28 DIAGNOSIS — Z23 Encounter for immunization: Secondary | ICD-10-CM

## 2020-02-28 DIAGNOSIS — R7303 Prediabetes: Secondary | ICD-10-CM

## 2020-02-28 DIAGNOSIS — M109 Gout, unspecified: Secondary | ICD-10-CM | POA: Diagnosis not present

## 2020-02-28 DIAGNOSIS — I10 Essential (primary) hypertension: Secondary | ICD-10-CM | POA: Diagnosis not present

## 2020-02-28 DIAGNOSIS — Z1159 Encounter for screening for other viral diseases: Secondary | ICD-10-CM | POA: Diagnosis not present

## 2020-02-28 LAB — BAYER DCA HB A1C WAIVED: HB A1C (BAYER DCA - WAIVED): 6.2 % (ref <7.0)

## 2020-02-28 MED ORDER — AMLODIPINE BESYLATE 5 MG PO TABS: 5.0000 mg | ORAL_TABLET | Freq: Every day | ORAL | 3 refills | Status: DC

## 2020-02-28 MED ORDER — ALLOPURINOL 100 MG PO TABS: 100.0000 mg | ORAL_TABLET | Freq: Every day | ORAL | 3 refills | Status: DC

## 2020-02-28 NOTE — Progress Notes (Signed)
BP 137/80   Pulse 70   Temp 98 F (36.7 C)   Ht '5\' 8"'  (1.727 m)   Wt 297 lb (134.7 kg)   SpO2 98%   BMI 45.16 kg/m    Subjective:   Patient ID: Leonard Rivera, male    DOB: 01/13/1981, 39 y.o.   MRN: [id number]  HPI: Leonard Rivera is a 39 y.o. male presenting on 02/28/2020 for No chief complaint on file.   HPI Hypertension Patient is currently on amlodipine, and their blood pressure today is 137/80. Patient denies any lightheadedness or dizziness. Patient denies headaches, blurred vision, chest pains, shortness of breath, or weakness. Denies any side effects from medication and is content with current medication.   Gout Last attack: 2 weeks ago Attacks this year: 1 Medication: Allopurinol Location of attacks: Left wrist and right knee  Relevant past medical, surgical, family and social history reviewed and updated as indicated. Interim medical history since our last visit reviewed. Allergies and medications reviewed and updated.  Review of Systems  Constitutional: Negative for chills and fever.  Respiratory: Negative for shortness of breath and wheezing.   Cardiovascular: Negative for chest pain and leg swelling.  Musculoskeletal: Negative for back pain and gait problem.  Skin: Negative for rash.  Neurological: Negative for dizziness, weakness and numbness.  All other systems reviewed and are negative.   Per HPI unless specifically indicated above   Allergies as of 02/28/2020   No Known Allergies     Medication List       Accurate as of February 28, 2020  4:07 PM. If you have any questions, ask your nurse or doctor.        allopurinol 100 MG tablet Commonly known as: ZYLOPRIM Take 1 tablet (100 mg total) by mouth daily.   amLODipine 5 MG tablet Commonly known as: NORVASC Take 1 tablet (5 mg total) by mouth daily.   cyclobenzaprine 10 MG tablet Commonly known as: FLEXERIL Take 1 tablet (10 mg total) by mouth 3 (three) times daily.        Objective:    BP 137/80   Pulse 70   Temp 98 F (36.7 C)   Ht '5\' 8"'  (1.727 m)   Wt 297 lb (134.7 kg)   SpO2 98%   BMI 45.16 kg/m   Wt Readings from Last 3 Encounters:  02/28/20 297 lb (134.7 kg)  02/05/20 292 lb (132.5 kg)  02/05/20 292 lb (132.5 kg)    Physical Exam Vitals and nursing note reviewed.  Constitutional:      General: He is not in acute distress.    Appearance: He is well-developed. He is not diaphoretic.  Eyes:     General: No scleral icterus.    Conjunctiva/sclera: Conjunctivae normal.  Neck:     Thyroid: No thyromegaly.  Cardiovascular:     Rate and Rhythm: Normal rate and regular rhythm.     Heart sounds: Normal heart sounds. No murmur heard.   Pulmonary:     Effort: Pulmonary effort is normal. No respiratory distress.     Breath sounds: Normal breath sounds. No wheezing.  Musculoskeletal:        General: Normal range of motion.     Cervical back: Neck supple.  Lymphadenopathy:     Cervical: No cervical adenopathy.  Skin:    General: Skin is warm and dry.     Findings: No rash.  Neurological:     Mental Status: He is alert and oriented to person, place, and  time.     Coordination: Coordination normal.  Psychiatric:        Behavior: Behavior normal.       Assessment & Plan:   Problem List Items Addressed This Visit    None    Visit Diagnoses    Prediabetes    -  Primary   Relevant Orders   CBC with Differential/Platelet   Lipid panel   Bayer DCA Hb A1c Waived   Encounter for hepatitis C screening test for low risk patient       Relevant Orders   Hepatitis C antibody   Essential hypertension       Relevant Medications   amLODipine (NORVASC) 5 MG tablet   Other Relevant Orders   Lipid panel   Gout, unspecified cause, unspecified chronicity, unspecified site       Relevant Orders   CMP14+EGFR   Uric acid   Need for immunization against influenza       Relevant Orders   Flu Vaccine QUAD 36+ mos IM (Completed)      Will check patient's  labs, blood pressure looks good, he says his only had one gout flare in the past year.  Continue current medication Follow up plan: Return in about 6 months (around 08/28/2020), or if symptoms worsen or fail to improve, for Hypertension and gout.  Counseling provided for all of the vaccine components No orders of the defined types were placed in this encounter.   Caryl Pina, MD [facility] 02/28/2020, 4:07 PM

## 2020-02-29 LAB — CMP14+EGFR
ALT: 29 IU/L (ref 0–44)
AST: 18 IU/L (ref 0–40)
Albumin/Globulin Ratio: 1.6 (ref 1.2–2.2)
Albumin: 4 g/dL (ref 4.0–5.0)
Alkaline Phosphatase: 102 IU/L (ref 44–121)
BUN/Creatinine Ratio: 15 (ref 9–20)
BUN: 12 mg/dL (ref 6–20)
Bilirubin Total: <0.2 mg/dL (ref 0.0–1.2)
CO2: 23 mmol/L (ref 20–29)
Calcium: 8.5 mg/dL — ABNORMAL LOW (ref 8.7–10.2)
Chloride: 106 mmol/L (ref 96–106)
Creatinine, Ser: 0.81 mg/dL (ref 0.76–1.27)
GFR calc Af Amer: 130 mL/min/{1.73_m2} (ref >59)
GFR calc non Af Amer: 113 mL/min/{1.73_m2} (ref >59)
Globulin, Total: 2.5 g/dL (ref 1.5–4.5)
Glucose: 109 mg/dL — ABNORMAL HIGH (ref 65–99)
Potassium: 4.3 mmol/L (ref 3.5–5.2)
Sodium: 144 mmol/L (ref 134–144)
Total Protein: 6.5 g/dL (ref 6.0–8.5)

## 2020-02-29 LAB — CBC WITH DIFFERENTIAL/PLATELET
Basophils Absolute: 0.1 10*3/uL (ref 0.0–0.2)
Basos: 1 %
EOS (ABSOLUTE): 0.4 10*3/uL (ref 0.0–0.4)
Eos: 5 %
Hematocrit: 45.2 % (ref 37.5–51.0)
Hemoglobin: 15.3 g/dL (ref 13.0–17.7)
Immature Grans (Abs): 0 10*3/uL (ref 0.0–0.1)
Immature Granulocytes: 0 %
Lymphocytes Absolute: 3.1 10*3/uL (ref 0.7–3.1)
Lymphs: 37 %
MCH: 29.5 pg (ref 26.6–33.0)
MCHC: 33.8 g/dL (ref 31.5–35.7)
MCV: 87 fL (ref 79–97)
Monocytes Absolute: 0.7 10*3/uL (ref 0.1–0.9)
Monocytes: 8 %
Neutrophils Absolute: 4.1 10*3/uL (ref 1.4–7.0)
Neutrophils: 49 %
Platelets: 302 10*3/uL (ref 150–450)
RBC: 5.19 x10E6/uL (ref 4.14–5.80)
RDW: 13.1 % (ref 11.6–15.4)
WBC: 8.4 10*3/uL (ref 3.4–10.8)

## 2020-02-29 LAB — LIPID PANEL
Chol/HDL Ratio: 4.1 ratio (ref 0.0–5.0)
Cholesterol, Total: 172 mg/dL (ref 100–199)
HDL: 42 mg/dL (ref >39)
LDL Chol Calc (NIH): 105 mg/dL — ABNORMAL HIGH (ref 0–99)
Triglycerides: 142 mg/dL (ref 0–149)
VLDL Cholesterol Cal: 25 mg/dL (ref 5–40)

## 2020-02-29 LAB — URIC ACID: Uric Acid: 7.2 mg/dL (ref 3.8–8.4)

## 2020-02-29 LAB — HEPATITIS C ANTIBODY: Hep C Virus Ab: <0.1 s/co ratio (ref 0.0–0.9)

## 2020-04-29 DIAGNOSIS — H60392 Other infective otitis externa, left ear: Secondary | ICD-10-CM | POA: Diagnosis not present

## 2020-07-08 ENCOUNTER — Encounter: Payer: Self-pay | Admitting: Nutrition

## 2020-07-08 ENCOUNTER — Encounter: Payer: Medicare Other | Attending: Family Medicine | Admitting: Nutrition

## 2020-07-08 ENCOUNTER — Other Ambulatory Visit: Payer: Self-pay

## 2020-07-08 VITALS — Ht 68.0 in | Wt 290.6 lb

## 2020-07-08 DIAGNOSIS — I10 Essential (primary) hypertension: Secondary | ICD-10-CM

## 2020-07-08 DIAGNOSIS — E66813 Obesity, class 3: Secondary | ICD-10-CM

## 2020-07-08 DIAGNOSIS — R7303 Prediabetes: Secondary | ICD-10-CM

## 2020-07-08 NOTE — Progress Notes (Signed)
  Medical Nutrition Therapy:  Appt start time: 1030 end time:  1100  Assessment:  Primary concerns today: obesity and HTN. . Lives with his grandma.  Has been walking 2 hours a day. Lost 7 lbs.  Doing great!  He is now using hand weights.  BP running at home 120-130's/70-80's.  Checks it once a week. BP is better. Scheduled to see PCP in a  month.  Goals previous set: Start jogging a little bit when walking-done Increase lower carb vegetables with lunch and dinner-done Increase high fiber foods-done Lose 2-3 per month.-done.  Eating 2 meals a day. Encouraged to focus on 3 meals a day and not skip meals to hinder is weight loss. Making better food choices.  Wt Readings from Last 3 Encounters:  07/08/20 290 lb 9.6 oz (131.8 kg)  02/28/20 297 lb (134.7 kg)  02/05/20 292 lb (132.5 kg)   Ht Readings from Last 3 Encounters:  07/08/20 5\' 8"  (1.727 m)  02/28/20 5\' 8"  (1.727 m)  02/05/20 5\' 8"  (1.727 m)   Body mass index is 44.19 kg/m. @BMIFA @ Facility age limit for growth percentiles is 20 years. Facility age limit for growth percentiles is 20 years.   Preferred Learning Style:    No preference indicated   Learning Readiness:   Ready  Change in progress   MEDICATIONS:   DIETARY INTAKE:  24-hr recall:  B ( AM): 2 eggs sandwich and applesauce, L ( PM): Toss salad, cheese, bacon bits, ranch, water Snk ( PM): apple  D ( PM): Usually something light, salad  Or ?? Snk ( PM):  Beverages:  Water- 4-5 bottles per day'  Usual physical activity: walks alot  Estimated energy needs: 1800  calories 200 g carbohydrates 135 g protein 50 g fat  Progress Towards Goal(s):  In progress.   Nutritional Diagnosis:  NB-1.1 Food and nutrition-related knowledge deficit As related to Obesity.  As evidenced by Obesity.    Intervention:  Nutrition and  Pre Diabetes education provided on My Plate, CHO counting, meal planning, portion sizes, timing of meals, avoiding snacks between  meals unless having a low blood sugar, target ranges for A1C and blood sugars, signs/symptoms and treatment of hyper/hypoglycemia, monitoring blood sugars, taking medications as prescribed, benefits of exercising 30 minutes per day and prevention of complications of DM.   Goals Keep up the great job!!! Drink gallon of water per day Keep walking daily and jog a little more. Lose 1 lb per week; 4 lbs a month  Teaching Method Utilized:  Visual Auditory Hands on  Handouts given during visit include:  The Plate Method  Meal Plan   Barriers to learning/adherence to lifestyle change: none  Demonstrated degree of understanding via:  Teach Back   Monitoring/Evaluation:  Dietary intake, exercise,  and body weight in 6 month(s).

## 2020-07-08 NOTE — Patient Instructions (Signed)
Goals Keep up the great job!!! Drink gallon of water per day Keep walking daily and jog a little more. Lose 1 lb per week; 4 lbs a month

## 2020-08-28 ENCOUNTER — Encounter: Payer: Self-pay | Admitting: Family Medicine

## 2020-08-28 ENCOUNTER — Other Ambulatory Visit: Payer: Self-pay

## 2020-08-28 ENCOUNTER — Ambulatory Visit (INDEPENDENT_AMBULATORY_CARE_PROVIDER_SITE_OTHER): Payer: Medicare Other | Admitting: Family Medicine

## 2020-08-28 VITALS — BP 141/89 | HR 79 | Ht 68.0 in | Wt 291.0 lb

## 2020-08-28 DIAGNOSIS — I10 Essential (primary) hypertension: Secondary | ICD-10-CM

## 2020-08-28 DIAGNOSIS — E1169 Type 2 diabetes mellitus with other specified complication: Secondary | ICD-10-CM | POA: Insufficient documentation

## 2020-08-28 DIAGNOSIS — R7303 Prediabetes: Secondary | ICD-10-CM | POA: Diagnosis not present

## 2020-08-28 LAB — BAYER DCA HB A1C WAIVED: HB A1C (BAYER DCA - WAIVED): 6.1 % (ref <7.0)

## 2020-08-28 NOTE — Progress Notes (Signed)
BP (!) 141/89   Pulse 79   Ht _0  (1.727 m)   Wt 291 lb (132 kg)   SpO2 95%   BMI 44.25 kg/m    Subjective:   Patient ID: Leonard Rivera, male    DOB: 02-23-1981, 40 y.o.   MRN: [id number]  HPI: Leonard Rivera is a 40 y.o. male presenting on 08/28/2020 for Medical Management of Chronic Issues and Hypertension   HPI Hypertension Patient is currently on amlodipine, and their blood pressure today is 141/89. Patient denies any lightheadedness or dizziness. Patient denies headaches, blurred vision, chest pains, shortness of breath, or weakness. Denies any side effects from medication and is content with current medication.   Prediabetes Patient comes in today for recheck of his diabetes. Patient has been currently taking no medication currently. Patient is not currently on an ACE inhibitor/ARB. Patient has not seen an ophthalmologist this year. Patient denies any issues with their feet. The symptom started onset as an adult hypertension ARE RELATED TO DM   Patient's hypertension and prediabetes are more complicated by the patient's morbid obesity.  Discussed weight loss and lifestyle modification and exercise with the patient.   Gout Last attack: Over a year ago Attacks this year: None Medication: Allopurinol Location of attacks: Feet  Relevant past medical, surgical, family and social history reviewed and updated as indicated. Interim medical history since our last visit reviewed. Allergies and medications reviewed and updated.  Review of Systems  Constitutional: Negative for chills and fever.  Respiratory: Negative for shortness of breath and wheezing.   Cardiovascular: Negative for chest pain and leg swelling.  Musculoskeletal: Negative for back pain and gait problem.  Skin: Negative for rash.  Neurological: Negative for dizziness, weakness and light-headedness.  All other systems reviewed and are negative.   Per HPI unless specifically indicated above   Allergies as of  08/28/2020   No Known Allergies     Medication List       Accurate as of August 28, 2020  2:11 PM. If you have any questions, ask your nurse or doctor.        allopurinol 100 MG tablet Commonly known as: ZYLOPRIM Take 1 tablet (100 mg total) by mouth daily.   amLODipine 5 MG tablet Commonly known as: NORVASC Take 1 tablet (5 mg total) by mouth daily.   cetirizine 10 MG tablet Commonly known as: ZYRTEC Take 10 mg by mouth daily.   cyclobenzaprine 10 MG tablet Commonly known as: FLEXERIL Take 1 tablet (10 mg total) by mouth 3 (three) times daily.        Objective:   BP (!) 141/89   Pulse 79   Ht _1  (1.727 m)   Wt 291 lb (132 kg)   SpO2 95%   BMI 44.25 kg/m   Wt Readings from Last 3 Encounters:  08/28/20 291 lb (132 kg)  07/08/20 290 lb 9.6 oz (131.8 kg)  02/28/20 297 lb (134.7 kg)    Physical Exam Vitals and nursing note reviewed.  Constitutional:      General: He is not in acute distress.    Appearance: He is well-developed. He is not diaphoretic.  Eyes:     General: No scleral icterus.    Conjunctiva/sclera: Conjunctivae normal.  Neck:     Thyroid: No thyromegaly.  Cardiovascular:     Rate and Rhythm: Normal rate and regular rhythm.     Heart sounds: Normal heart sounds. No murmur heard.   Pulmonary:  Effort: Pulmonary effort is normal. No respiratory distress.     Breath sounds: Normal breath sounds. No wheezing.  Musculoskeletal:        General: No swelling. Normal range of motion.     Cervical back: Neck supple.  Lymphadenopathy:     Cervical: No cervical adenopathy.  Skin:    General: Skin is warm and dry.     Findings: No rash.  Neurological:     Mental Status: He is alert and oriented to person, place, and time.     Coordination: Coordination normal.  Psychiatric:        Behavior: Behavior normal.       Assessment & Plan:   Problem List Items Addressed This Visit      Cardiovascular and Mediastinum   Hypertension -  Primary   Relevant Orders   CMP14+EGFR     Other   Morbid obesity ([place])   Relevant Orders   Lipid panel   Prediabetes   Relevant Orders   CBC with Differential/Platelet   CMP14+EGFR   Bayer DCA Hb A1c Waived      Continue medications currently. Follow up plan: Return in about 3 months (around 11/27/2020), or if symptoms worsen or fail to improve, for Hypertension and prediabetes.  Counseling provided for all of the vaccine components Orders Placed This Encounter  Procedures  . CBC with Differential/Platelet  . CMP14+EGFR  . Lipid panel  . Bayer [facility] Hb A1c [place], MD [facility] 08/28/2020, 2:11 PM

## 2020-08-29 LAB — LIPID PANEL
Chol/HDL Ratio: 4 ratio (ref 0.0–5.0)
Cholesterol, Total: 181 mg/dL (ref 100–199)
HDL: 45 mg/dL (ref >39)
LDL Chol Calc (NIH): 117 mg/dL — ABNORMAL HIGH (ref 0–99)
Triglycerides: 105 mg/dL (ref 0–149)
VLDL Cholesterol Cal: 19 mg/dL (ref 5–40)

## 2020-08-29 LAB — CMP14+EGFR
ALT: 28 IU/L (ref 0–44)
AST: 21 IU/L (ref 0–40)
Albumin/Globulin Ratio: 1.8 (ref 1.2–2.2)
Albumin: 4.3 g/dL (ref 4.0–5.0)
Alkaline Phosphatase: 109 IU/L (ref 44–121)
BUN/Creatinine Ratio: 14 (ref 9–20)
BUN: 13 mg/dL (ref 6–20)
Bilirubin Total: 0.2 mg/dL (ref 0.0–1.2)
CO2: 23 mmol/L (ref 20–29)
Calcium: 9 mg/dL (ref 8.7–10.2)
Chloride: 103 mmol/L (ref 96–106)
Creatinine, Ser: 0.96 mg/dL (ref 0.76–1.27)
Globulin, Total: 2.4 g/dL (ref 1.5–4.5)
Glucose: 95 mg/dL (ref 65–99)
Potassium: 4.3 mmol/L (ref 3.5–5.2)
Sodium: 139 mmol/L (ref 134–144)
Total Protein: 6.7 g/dL (ref 6.0–8.5)
eGFR: 103 mL/min/{1.73_m2} (ref >59)

## 2020-08-29 LAB — CBC WITH DIFFERENTIAL/PLATELET
Basophils Absolute: 0.1 10*3/uL (ref 0.0–0.2)
Basos: 1 %
EOS (ABSOLUTE): 0.4 10*3/uL (ref 0.0–0.4)
Eos: 4 %
Hematocrit: 47 % (ref 37.5–51.0)
Hemoglobin: 16.1 g/dL (ref 13.0–17.7)
Immature Grans (Abs): 0.1 10*3/uL (ref 0.0–0.1)
Immature Granulocytes: 1 %
Lymphocytes Absolute: 2.9 10*3/uL (ref 0.7–3.1)
Lymphs: 33 %
MCH: 29.4 pg (ref 26.6–33.0)
MCHC: 34.3 g/dL (ref 31.5–35.7)
MCV: 86 fL (ref 79–97)
Monocytes Absolute: 0.6 10*3/uL (ref 0.1–0.9)
Monocytes: 7 %
Neutrophils Absolute: 4.9 10*3/uL (ref 1.4–7.0)
Neutrophils: 54 %
Platelets: 281 10*3/uL (ref 150–450)
RBC: 5.47 x10E6/uL (ref 4.14–5.80)
RDW: 13.1 % (ref 11.6–15.4)
WBC: 8.9 10*3/uL (ref 3.4–10.8)

## 2020-09-03 ENCOUNTER — Other Ambulatory Visit: Payer: Self-pay

## 2020-09-03 MED ORDER — ATORVASTATIN CALCIUM 20 MG PO TABS: 20.0000 mg | ORAL_TABLET | Freq: Every evening | ORAL | 1 refills | Status: DC

## 2020-09-16 ENCOUNTER — Encounter: Payer: Self-pay | Admitting: *Deleted
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
## 2020-11-20 ENCOUNTER — Other Ambulatory Visit: Payer: Self-pay

## 2020-11-20 ENCOUNTER — Encounter: Payer: Self-pay | Admitting: Family Medicine

## 2020-11-20 ENCOUNTER — Ambulatory Visit (INDEPENDENT_AMBULATORY_CARE_PROVIDER_SITE_OTHER): Payer: Medicare Other | Admitting: Family Medicine

## 2020-11-20 VITALS — BP 145/94 | HR 78 | Ht 68.0 in | Wt 291.0 lb

## 2020-11-20 DIAGNOSIS — M109 Gout, unspecified: Secondary | ICD-10-CM | POA: Insufficient documentation

## 2020-11-20 DIAGNOSIS — I10 Essential (primary) hypertension: Secondary | ICD-10-CM | POA: Diagnosis not present

## 2020-11-20 DIAGNOSIS — R7303 Prediabetes: Secondary | ICD-10-CM

## 2020-11-20 LAB — BAYER DCA HB A1C WAIVED: HB A1C (BAYER DCA - WAIVED): 5.8 % (ref <7.0)

## 2020-11-20 MED ORDER — AMLODIPINE BESYLATE 5 MG PO TABS
5.0000 mg | ORAL_TABLET | Freq: Every day | ORAL | 3 refills | Status: DC
Start: 2020-11-20 — End: 2021-05-25

## 2020-11-20 MED ORDER — ALLOPURINOL 100 MG PO TABS: 100.0000 mg | ORAL_TABLET | Freq: Every day | ORAL | 3 refills | Status: DC

## 2020-11-20 MED ORDER — ATORVASTATIN CALCIUM 20 MG PO TABS: 20.0000 mg | ORAL_TABLET | Freq: Every evening | ORAL | 3 refills | Status: DC

## 2020-11-20 NOTE — Progress Notes (Signed)
BP (!) 145/94   Pulse 78   Ht '5\' 8"'  (1.727 m)   Wt 291 lb (132 kg)   SpO2 96%   BMI 44.25 kg/m    Subjective:   Patient ID: [name], male    DOB: [birth year], 40 y.o.   MRN: [id number]  HPI: Hanzel Pizzo is a 40 y.o. male presenting on 11/20/2020 for Medical Management of Chronic Issues, Hypertension, and Ankle Pain (Left. Pain started yesterday. Denies injury, swelling, redness or hot to touch)   HPI Hypertension Patient is currently on amlodipine, and their blood pressure today is 134/89. Patient denies any lightheadedness or dizziness. Patient denies headaches, blurred vision, chest pains, shortness of breath, or weakness. Denies any side effects from medication and is content with current medication.   Hyperlipidemia Patient is coming in for recheck of his hyperlipidemia. The patient is currently taking atorvastatin. They deny any issues with myalgias or history of liver damage from it. They deny any focal numbness or weakness or chest pain.   Patient complains of some right ankle pain and soreness that just started yesterday.  He denies any redness or swelling, it does hurt to touch but is able to walk on it and does not have any pain and is able to move around just fine without any pain.  He says it just started hurting him yesterday.  He was wondering where it could be his gout or not but it does not look like that at this point  Patient also has prediabetes and will check his levels  Relevant past medical, surgical, family and social history reviewed and updated as indicated. Interim medical history since our last visit reviewed. Allergies and medications reviewed and updated.  Review of Systems  Constitutional:  Negative for chills and fever.  Eyes:  Negative for discharge.  Respiratory:  Negative for shortness of breath and wheezing.   Cardiovascular:  Negative for chest pain and leg swelling.  Musculoskeletal:  Negative for back pain and gait problem.  Skin:   Negative for rash.  Neurological:  Negative for dizziness, weakness and light-headedness.  All other systems reviewed and are negative.  Per HPI unless specifically indicated above   Allergies as of 11/20/2020   No Known Allergies      Medication List        Accurate as of November 20, 2020  2:05 PM. If you have any questions, ask your nurse or doctor.          allopurinol 100 MG tablet Commonly known as: ZYLOPRIM Take 1 tablet (100 mg total) by mouth daily.   amLODipine 5 MG tablet Commonly known as: NORVASC Take 1 tablet (5 mg total) by mouth daily.   atorvastatin 20 MG tablet Commonly known as: LIPITOR Take 1 tablet (20 mg total) by mouth at bedtime.   cetirizine 10 MG tablet Commonly known as: ZYRTEC Take 10 mg by mouth daily.   cyclobenzaprine 10 MG tablet Commonly known as: FLEXERIL Take 1 tablet (10 mg total) by mouth 3 (three) times daily.         Objective:   BP (!) 145/94   Pulse 78   Ht '5\' 8"'  (1.727 m)   Wt 291 lb (132 kg)   SpO2 96%   BMI 44.25 kg/m   Wt Readings from Last 3 Encounters:  11/20/20 291 lb (132 kg)  08/28/20 291 lb (132 kg)  07/08/20 290 lb 9.6 oz (131.8 kg)    Physical Exam Vitals and nursing note reviewed.  Constitutional:      General: He is not in acute distress.    Appearance: He is well-developed. He is not diaphoretic.  Eyes:     General: No scleral icterus.    Conjunctiva/sclera: Conjunctivae normal.  Neck:     Thyroid: No thyromegaly.  Cardiovascular:     Rate and Rhythm: Normal rate and regular rhythm.     Heart sounds: Normal heart sounds. No murmur heard. Pulmonary:     Effort: Pulmonary effort is normal. No respiratory distress.     Breath sounds: Normal breath sounds. No wheezing.  Musculoskeletal:        General: Normal range of motion.     Cervical back: Neck supple.  Lymphadenopathy:     Cervical: No cervical adenopathy.  Skin:    General: Skin is warm and dry.     Findings: No rash.   Neurological:     Mental Status: He is alert and oriented to person, place, and time.     Coordination: Coordination normal.  Psychiatric:        Behavior: Behavior normal.      Assessment & Plan:   Problem List Items Addressed This Visit       Cardiovascular and Mediastinum   Hypertension - Primary   Relevant Medications   amLODipine (NORVASC) 5 MG tablet   atorvastatin (LIPITOR) 20 MG tablet   Other Relevant Orders   CBC with Differential/Platelet   Lipid panel     Other   Morbid obesity ([facility])   Relevant Orders   Lipid panel   Prediabetes   Relevant Orders   Bayer DCA Hb A1c Waived   Lipid panel   Gout   Other Visit Diagnoses     Essential hypertension       Relevant Medications   amLODipine (NORVASC) 5 MG tablet   atorvastatin (LIPITOR) 20 MG tablet   Other Relevant Orders   CMP14+EGFR   Lipid panel       Patient's ankle pain is likely a bruise, no pain with ambulating or with range of motion, just hurts when he pushes on that specific spot.  Yeah I think it was recent and is has been recent and yes sounds good thank you Follow up plan: No follow-ups on file.  Counseling provided for all of the vaccine components Orders Placed This Encounter  Procedures   Bayer [place] Hb A1c Waived   CBC with Differential/Platelet   CMP14+EGFR   Lipid panel    Caryl Pina, MD [facility] 11/20/2020, 2:05 PM

## 2020-11-21 ENCOUNTER — Ambulatory Visit: Payer: Medicare Other | Admitting: Family Medicine

## 2020-11-21 LAB — CBC WITH DIFFERENTIAL/PLATELET
Basophils Absolute: 0.1 10*3/uL (ref 0.0–0.2)
Basos: 1 %
EOS (ABSOLUTE): 0.4 10*3/uL (ref 0.0–0.4)
Eos: 4 %
Hematocrit: 49.2 % (ref 37.5–51.0)
Hemoglobin: 16.5 g/dL (ref 13.0–17.7)
Immature Grans (Abs): 0 10*3/uL (ref 0.0–0.1)
Immature Granulocytes: 0 %
Lymphocytes Absolute: 3.2 10*3/uL — ABNORMAL HIGH (ref 0.7–3.1)
Lymphs: 35 %
MCH: 29 pg (ref 26.6–33.0)
MCHC: 33.5 g/dL (ref 31.5–35.7)
MCV: 87 fL (ref 79–97)
Monocytes Absolute: 0.6 10*3/uL (ref 0.1–0.9)
Monocytes: 6 %
Neutrophils Absolute: 5.1 10*3/uL (ref 1.4–7.0)
Neutrophils: 54 %
Platelets: 304 10*3/uL (ref 150–450)
RBC: 5.69 x10E6/uL (ref 4.14–5.80)
RDW: 12.8 % (ref 11.6–15.4)
WBC: 9.4 10*3/uL (ref 3.4–10.8)

## 2020-11-21 LAB — CMP14+EGFR
ALT: 37 IU/L (ref 0–44)
AST: 28 IU/L (ref 0–40)
Albumin/Globulin Ratio: 2 (ref 1.2–2.2)
Albumin: 4.3 g/dL (ref 4.0–5.0)
Alkaline Phosphatase: 108 IU/L (ref 44–121)
BUN/Creatinine Ratio: 11 (ref 9–20)
BUN: 10 mg/dL (ref 6–20)
Bilirubin Total: 0.2 mg/dL (ref 0.0–1.2)
CO2: 20 mmol/L (ref 20–29)
Calcium: 8.8 mg/dL (ref 8.7–10.2)
Chloride: 109 mmol/L — ABNORMAL HIGH (ref 96–106)
Creatinine, Ser: 0.93 mg/dL (ref 0.76–1.27)
Globulin, Total: 2.2 g/dL (ref 1.5–4.5)
Glucose: 95 mg/dL (ref 65–99)
Potassium: 4.7 mmol/L (ref 3.5–5.2)
Sodium: 142 mmol/L (ref 134–144)
Total Protein: 6.5 g/dL (ref 6.0–8.5)
eGFR: 107 mL/min/{1.73_m2} (ref >59)

## 2020-11-21 LAB — LIPID PANEL
Chol/HDL Ratio: 3.8 ratio (ref 0.0–5.0)
Cholesterol, Total: 166 mg/dL (ref 100–199)
HDL: 44 mg/dL (ref >39)
LDL Chol Calc (NIH): 94 mg/dL (ref 0–99)
Triglycerides: 158 mg/dL — ABNORMAL HIGH (ref 0–149)
VLDL Cholesterol Cal: 28 mg/dL (ref 5–40)

## 2020-12-17 ENCOUNTER — Ambulatory Visit (INDEPENDENT_AMBULATORY_CARE_PROVIDER_SITE_OTHER): Payer: Medicare Other

## 2020-12-17 VITALS — Ht 68.0 in | Wt 291.0 lb

## 2020-12-17 DIAGNOSIS — Z0000 Encounter for general adult medical examination without abnormal findings: Secondary | ICD-10-CM

## 2020-12-17 NOTE — Progress Notes (Signed)
Subjective:   Leonard Rivera is a 40 y.o. male who presents for Medicare Annual/Subsequent preventive examination.  Virtual Visit via Telephone Note  I connected with  Leonard Rivera on 12/17/20 at  2:00 PM EDT by telephone and verified that I am speaking with the correct person using two identifiers.  Location: Patient: Home Provider: [facility] Persons participating in the virtual visit: patient/Nurse Health Advisor   I discussed the limitations, risks, security and privacy concerns of performing an evaluation and management service by telephone and the availability of in person appointments. The patient expressed understanding and agreed to proceed.  Interactive audio and video telecommunications were attempted between this nurse and patient, however failed, due to patient having technical difficulties OR patient did not have access to video capability.  We continued and completed visit with audio only.  Some vital signs may be absent or patient reported.   Leonard Rivera E Leonard Devery, LPN   Review of Systems     Cardiac Risk Factors include: male gender;hypertension;dyslipidemia;obesity (BMI >30kg/m2)     Objective:    Today's Vitals   12/17/20 1316  Weight: 291 lb (132 kg)  Height: '5\' 8"'  (1.727 m)   Body mass index is 44.25 kg/m.  Advanced Directives 12/17/2020 02/05/2020 12/17/2019 12/06/2018 10/25/2018 12/04/2017 05/04/2017  Does Patient Have a Medical Advance Directive? Yes No Yes No No No No  Type of Paramedic of [place];Living will - [place]  Does patient want to make changes to medical advance directive? - - No - Patient declined - - - -  Copy of [place] in Chart? No - copy requested - - - - - -  Would patient like information on creating a medical advance directive? - - No - Patient declined Yes (MAU/Ambulatory/Procedural Areas - Information given) No - Patient declined No - Patient declined -    Current  Medications (verified) Outpatient Encounter Medications as of 12/17/2020  Medication Sig   allopurinol (ZYLOPRIM) 100 MG tablet Take 1 tablet (100 mg total) by mouth daily.   amLODipine (NORVASC) 5 MG tablet Take 1 tablet (5 mg total) by mouth daily.   atorvastatin (LIPITOR) 20 MG tablet Take 1 tablet (20 mg total) by mouth at bedtime.   cetirizine (ZYRTEC) 10 MG tablet Take 10 mg by mouth daily.   cyclobenzaprine (FLEXERIL) 10 MG tablet Take 1 tablet (10 mg total) by mouth 3 (three) times daily.   No facility-administered encounter medications on file as of 12/17/2020.    Allergies (verified) Patient has no known allergies.   History: Past Medical History:  Diagnosis Date   Gout    Hypertension    Morbid obesity ([place])    History reviewed. No pertinent surgical history. Family History  Problem Relation Age of Onset   Asthma Brother    Social History   Socioeconomic History   Marital status: Single    Spouse name: Not on file   Number of children: Not on file   Years of education: Not on file   Highest education level: 12th grade  Occupational History   Occupation: Disabled  Tobacco Use   Smoking status: Former    Types: Cigars   Smokeless tobacco: Never  Scientific laboratory technician Use: Never used  Substance and Sexual Activity   Alcohol use: No   Drug use: No   Sexual activity: Yes  Other Topics Concern   Not on file  Social History Narrative  Lives with grandmother - no children   Social Determinants of Radio broadcast assistant Strain: Low Risk    Difficulty of Paying Living Expenses: Not hard at all  Food Insecurity: No Food Insecurity   Worried About Charity fundraiser in the Last Year: Never true   Arboriculturist in the Last Year: Never true  Transportation Needs: No Transportation Needs   Lack of Transportation (Medical): No   Lack of Transportation (Non-Medical): No  Physical Activity: Sufficiently Active   Days of Exercise per Week: 7 days   Minutes  of Exercise per Session: 60 min  Stress: No Stress Concern Present   Feeling of Stress : Not at all  Social Connections: Moderately Integrated   Frequency of Communication with Friends and Family: Twice a week   Frequency of Social Gatherings with Friends and Family: Twice a week   Attends Religious Services: More than 4 times per year   Active Member of [employer] or Organizations: Yes   Attends Music therapist: More than 4 times per year   Marital Status: Never married    Tobacco Counseling Counseling given: Not Answered   Clinical Intake:  Pre-visit preparation completed: Yes  Pain : No/denies pain     BMI - recorded: 44.25 Nutritional Status: BMI > 30  Obese Nutritional Risks: None Diabetes: No  How often do you need to have someone help you when you read instructions, pamphlets, or other written materials from your doctor or pharmacy?: 1 - Never  Diabetic? No  Interpreter Needed?: No  Information entered by :: Leonard Cruces, LPN   Activities of Daily Living In your present state of health, do you have any difficulty performing the following activities: 12/17/2020  Hearing? N  Vision? N  Difficulty concentrating or making decisions? N  Walking or climbing stairs? N  Dressing or bathing? N  Doing errands, shopping? N  Preparing Food and eating ? N  Using the Toilet? N  In the past six months, have you accidently leaked urine? N  Do you have problems with loss of bowel control? N  Managing your Medications? N  Managing your Finances? N  Housekeeping or managing your Housekeeping? N  Some recent data might be hidden    Patient Care Team: Leonard, Fransisca Kaufmann, MD as PCP - General (Family Medicine) Leonard Rivera, [place] (Optometry) Leonard Rivera, RD as Dietitian (Nutrition)  Indicate any recent Medical Services you may have received from other than [facility] providers in the past year (date may be approximate).     Assessment:   This is a routine  wellness examination for Leonard Rivera.  Hearing/Vision screen Hearing Screening - Comments:: Denies hearing difficulties  Vision Screening - Comments:: Wears eyeglasses - behind on annual eye exam at [place] issues and exercise activities discussed: Current Exercise Habits: Home exercise routine, Type of exercise: walking, Time (Minutes): 60, Frequency (Times/Week): 7, Weekly Exercise (Minutes/Week): 420, Intensity: Mild, Exercise limited by: orthopedic condition(s)   Goals Addressed             This Visit's Progress    Exercise 3x per week (30 min per time)   On track    Try to exercise for at least 30 minutes, 3 times weekly        Depression Screen PHQ 2/9 Scores 12/17/2020 11/20/2020 08/28/2020 12/17/2019 05/02/2019 03/23/2019 12/28/2018  PHQ - 2 Score 0 0 0 0 0 0 0    Fall Risk Fall Risk  12/17/2020 11/20/2020 08/28/2020 02/05/2020 12/17/2019  Falls in the past year? 0 0 0 1 0  Number falls in past yr: 0 - - 0 -  Injury with Fall? 0 - - 1 -  Risk for fall due to : Orthopedic patient;Impaired vision;Medication side effect - - - -  Follow up Falls prevention discussed - - Falls prevention discussed -    FALL RISK PREVENTION PERTAINING TO THE HOME:  Any stairs in or around the home? Yes  If so, are there any without handrails? No  Home free of loose throw rugs in walkways, pet beds, electrical cords, etc? Yes  Adequate lighting in your home to reduce risk of falls? Yes   ASSISTIVE DEVICES UTILIZED TO PREVENT FALLS:  Life alert? No  Use of a cane, walker or w/c? No  Grab bars in the bathroom? Yes  Shower chair or bench in shower? Yes  Elevated toilet seat or a handicapped toilet? No   TIMED UP AND GO:  Was the test performed? No . Telephonic visit  Cognitive Function: Normal cognitive status assessed by direct observation by this Nurse Health Advisor. No abnormalities found.       6CIT Screen 12/17/2019 12/06/2018  What Year? 0 points 0 points  What month? 0  points 0 points  What time? 0 points 0 points  Count back from 20 0 points 0 points  Months in reverse 2 points 0 points  Repeat phrase 0 points 0 points  Total Score 2 0    Immunizations Immunization History  Administered Date(s) Administered   DTaP 07/14/1981, 09/15/1981, 11/10/1981   Hepatitis B 01/20/1995, 02/24/1995, 08/11/1995   IPV 07/14/1981, 09/15/1981, 11/10/1981   Influenza,inj,Quad PF,6+ Mos 02/01/2016, 02/28/2020   MMR 08/31/1982   Moderna Sars-Covid-2 Vaccination 08/10/2019, 09/13/2019   Tdap 01/01/2016    TDAP status: Up to date  Flu Vaccine status: Up to date  Pneumococcal vaccine status: Up to date  Covid-19 vaccine status: Completed vaccines  Qualifies for Shingles Vaccine? No   Zostavax completed No    Screening Tests Health Maintenance  Topic Date Due   INFLUENZA VACCINE  12/01/2020   COVID-19 Vaccine (3 - Booster for Moderna series) 11/16/2021 (Originally 02/13/2020)   TETANUS/TDAP  12/31/2025   Hepatitis C Screening  Completed   HIV Screening  Completed   HPV VACCINES  Aged Out   Pneumococcal Vaccine 23-107 Years old  [place] Maintenance Due  Topic Date Due   INFLUENZA VACCINE  12/01/2020    Colorectal Cancer screening: Due at age 40  Lung Cancer Screening: (Low Dose CT Chest recommended if Age 80-80 years, 30 pack-year currently smoking OR have quit w/in 15years.) does not qualify.   Additional Screening:  Hepatitis C Screening: does not qualify; Completed 02/28/2020  Vision Screening: Recommended annual ophthalmology exams for early detection of glaucoma and other disorders of the eye. Is the patient up to date with their annual eye exam?  No  Who is the provider or what is the name of the office in which the patient attends annual eye exams? [facility] If pt is not established with a provider, would they like to be referred to a provider to establish care? No .   Dental Screening: Recommended  annual dental exams for proper oral hygiene  Community Resource Referral / Chronic Care Management: CRR required this visit?  No   CCM required this visit?  No      Plan:  I have personally reviewed and noted the following in the patient's chart:   Medical and social history Use of alcohol, tobacco or illicit drugs  Current medications and supplements including opioid prescriptions. Patient is not currently taking opioid prescriptions. Functional ability and status Nutritional status Physical activity Advanced directives List of other physicians Hospitalizations, surgeries, and ER visits in previous 12 months Vitals Screenings to include cognitive, depression, and falls Referrals and appointments  In addition, I have reviewed and discussed with patient certain preventive protocols, quality metrics, and best practice recommendations. A written personalized care plan for preventive services as well as general preventive health recommendations were provided to patient.     Sandrea Hammond, LPN   [id number]   Nurse Notes: None

## 2020-12-17 NOTE — Patient Instructions (Signed)
[name] , Thank you for taking time to come for your Medicare Wellness Visit. I appreciate your ongoing commitment to your health goals. Please review the following plan we discussed and let me know if I can assist you in the future.   Screening recommendations/referrals: Colonoscopy: Due at age 39 Recommended yearly ophthalmology/optometry visit for glaucoma screening and checkup Recommended yearly dental visit for hygiene and checkup  Vaccinations: Influenza vaccine: Done 02/28/2020 - Repeat annually Pneumococcal vaccine: Due after age 21 Tdap vaccine: Done 01/01/2016 - Repeat in 10 years Shingles vaccine: Due after age 39   Covid-19: Done 08/10/19 & 09/13/19 - had booster - patient to bring card to next visit  Advanced directives: Please bring a copy of your health care power of attorney and living will to the office to be added to your chart at your convenience.   Conditions/risks identified: Keep up the great work!   Next appointment: Follow up in one year for your annual wellness visit   Preventive Care 35-64 Years, Male Preventive care refers to lifestyle choices and visits with your health care provider that can promote health and wellness. What does preventive care include? A yearly physical exam. This is also called an annual well check. Dental exams once or twice a year. Routine eye exams. Ask your health care provider how often you should have your eyes checked. Personal lifestyle choices, including: Daily care of your teeth and gums. Regular physical activity. Eating a healthy diet. Avoiding tobacco and drug use. Limiting alcohol use. Practicing safe sex. Taking low-dose aspirin every day starting at age 60. What happens during an annual well check? The services and screenings done by your health care provider during your annual well check will depend on your age, overall health, lifestyle risk factors, and family history of disease. Counseling  Your health care  provider may ask you questions about your: Alcohol use. Tobacco use. Drug use. Emotional well-being. Home and relationship well-being. Sexual activity. Eating habits. Work and work Astronomer. Screening  You may have the following tests or measurements: Height, weight, and BMI. Blood pressure. Lipid and cholesterol levels. These may be checked every 5 years, or more frequently if you are over 68 years old. Skin check. Lung cancer screening. You may have this screening every year starting at age 43 if you have a 30-pack-year history of smoking and currently smoke or have quit within the past 15 years. Fecal occult blood test (FOBT) of the stool. You may have this test every year starting at age 25. Flexible sigmoidoscopy or colonoscopy. You may have a sigmoidoscopy every 5 years or a colonoscopy every 10 years starting at age 40. Prostate cancer screening. Recommendations will vary depending on your family history and other risks. Hepatitis C blood test. Hepatitis B blood test. Sexually transmitted disease (STD) testing. Diabetes screening. This is done by checking your blood sugar (glucose) after you have not eaten for a while (fasting). You may have this done every 1-3 years. Discuss your test results, treatment options, and if necessary, the need for more tests with your health care provider. Vaccines  Your health care provider may recommend certain vaccines, such as: Influenza vaccine. This is recommended every year. Tetanus, diphtheria, and acellular pertussis (Tdap, Td) vaccine. You may need a Td booster every 10 years. Zoster vaccine. You may need this after age 40. Pneumococcal 13-valent conjugate (PCV13) vaccine. You may need this if you have certain conditions and have not been vaccinated. Pneumococcal polysaccharide (PPSV23) vaccine. You may  need one or two doses if you smoke cigarettes or if you have certain conditions. Talk to your health care provider about which  screenings and vaccines you need and how often you need them. This information is not intended to replace advice given to you by your health care provider. Make sure you discuss any questions you have with your health care provider. Document Released: 05/16/2015 Document Revised: 01/07/2016 Document Reviewed: 02/18/2015 Elsevier Interactive Patient Education  2017 [employer].  Fall Prevention in the Home Falls can cause injuries. They can happen to people of all ages. There are many things you can do to make your home safe and to help prevent falls. What can I do on the outside of my home? Regularly fix the edges of walkways and driveways and fix any cracks. Remove anything that might make you trip as you walk through a door, such as a raised step or threshold. Trim any bushes or trees on the path to your home. Use bright outdoor lighting. Clear any walking paths of anything that might make someone trip, such as rocks or tools. Regularly check to see if handrails are loose or broken. Make sure that both sides of any steps have handrails. Any raised decks and porches should have guardrails on the edges. Have any leaves, snow, or ice cleared regularly. Use sand or salt on walking paths during winter. Clean up any spills in your garage right away. This includes oil or grease spills. What can I do in the bathroom? Use night lights. Install grab bars by the toilet and in the tub and shower. Do not use towel bars as grab bars. Use non-skid mats or decals in the tub or shower. If you need to sit down in the shower, use a plastic, non-slip stool. Keep the floor dry. Clean up any water that spills on the floor as soon as it happens. Remove soap buildup in the tub or shower regularly. Attach bath mats securely with double-sided non-slip rug tape. Do not have throw rugs and other things on the floor that can make you trip. What can I do in the bedroom? Use night lights. Make sure that you have a  light by your bed that is easy to reach. Do not use any sheets or blankets that are too big for your bed. They should not hang down onto the floor. Have a firm chair that has side arms. You can use this for support while you get dressed. Do not have throw rugs and other things on the floor that can make you trip. What can I do in the kitchen? Clean up any spills right away. Avoid walking on wet floors. Keep items that you use a lot in easy-to-reach places. If you need to reach something above you, use a strong step stool that has a grab bar. Keep electrical cords out of the way. Do not use floor polish or wax that makes floors slippery. If you must use wax, use non-skid floor wax. Do not have throw rugs and other things on the floor that can make you trip. What can I do with my stairs? Do not leave any items on the stairs. Make sure that there are handrails on both sides of the stairs and use them. Fix handrails that are broken or loose. Make sure that handrails are as long as the stairways. Check any carpeting to make sure that it is firmly attached to the stairs. Fix any carpet that is loose or worn. Avoid having  throw rugs at the top or bottom of the stairs. If you do have throw rugs, attach them to the floor with carpet tape. Make sure that you have a light switch at the top of the stairs and the bottom of the stairs. If you do not have them, ask someone to add them for you. What else can I do to help prevent falls? Wear shoes that: Do not have high heels. Have rubber bottoms. Are comfortable and fit you well. Are closed at the toe. Do not wear sandals. If you use a stepladder: Make sure that it is fully opened. Do not climb a closed stepladder. Make sure that both sides of the stepladder are locked into place. Ask someone to hold it for you, if possible. Clearly mark and make sure that you can see: Any grab bars or handrails. First and last steps. Where the edge of each step  is. Use tools that help you move around (mobility aids) if they are needed. These include: Canes. Walkers. Scooters. Crutches. Turn on the lights when you go into a dark area. Replace any light bulbs as soon as they burn out. Set up your furniture so you have a clear path. Avoid moving your furniture around. If any of your floors are uneven, fix them. If there are any pets around you, be aware of where they are. Review your medicines with your doctor. Some medicines can make you feel dizzy. This can increase your chance of falling. Ask your doctor what other things that you can do to help prevent falls. This information is not intended to replace advice given to you by your health care provider. Make sure you discuss any questions you have with your health care provider. Document Released: 02/13/2009 Document Revised: 09/25/2015 Document Reviewed: 05/24/2014 Elsevier Interactive Patient Education  2017 [employer].

## 2020-12-31 ENCOUNTER — Encounter: Payer: Self-pay | Admitting: Nurse Practitioner

## 2020-12-31 ENCOUNTER — Ambulatory Visit (INDEPENDENT_AMBULATORY_CARE_PROVIDER_SITE_OTHER): Payer: Medicare Other | Admitting: Nurse Practitioner

## 2020-12-31 DIAGNOSIS — G4489 Other headache syndrome: Secondary | ICD-10-CM | POA: Diagnosis not present

## 2020-12-31 NOTE — Assessment & Plan Note (Signed)
Patient is reporting new headache in the past 2 to 3 days.  No other signs and symptoms.  Completed COVID-19 swab results pending.  Encouraged hydration and Tylenol for headache.

## 2020-12-31 NOTE — Progress Notes (Signed)
   Virtual Visit  Note Due to COVID-19 pandemic this visit was conducted virtually. This visit type was conducted due to national recommendations for restrictions regarding the COVID-19 Pandemic (e.g. social distancing, sheltering in place) in an effort to limit this patient's exposure and mitigate transmission in our community. All issues noted in this document were discussed and addressed.  A physical exam was not performed with this format.  I connected with Leonard Rivera on 12/31/20 at 11:30 am by telephone and verified that I am speaking with the correct person using two identifiers. Leonard Rivera is currently located at home during visit. The provider, Daryll Drown, NP is located in their office at time of visit.  I discussed the limitations, risks, security and privacy concerns of performing an evaluation and management service by telephone and the availability of in person appointments. I also discussed with the patient that there may be a patient responsible charge related to this service. The patient expressed understanding and agreed to proceed.   History and Present Illness:  HPI   Patient is reporting signs and symptoms of headache in the last 2 to 3 days.  No other symptoms associated with headache.  No nausea, fever, body aches, or lethargy.   Review of Systems  Constitutional:  Negative for chills and fever.  HENT:  Negative for congestion and sinus pain.   Skin:  Negative for rash.  Neurological:  Positive for headaches.  All other systems reviewed and are negative.   Observations/Objective: Televisit patient was distress.  Assessment and Plan: Patient is reporting new headache in the past 2 to 3 days.  No other signs and symptoms.  Completed COVID-19 swab results pending.  Encouraged hydration and Tylenol for headache.  Follow Up Instructions: Follow-up with worsening unresolved symptoms.    I discussed the assessment and treatment plan with the patient. The  patient was provided an opportunity to ask questions and all were answered. The patient agreed with the plan and demonstrated an understanding of the instructions.   The patient was advised to call back or seek an in-person evaluation if the symptoms worsen or if the condition fails to improve as anticipated.  The above assessment and management plan was discussed with the patient. The patient verbalized understanding of and has agreed to the management plan. Patient is aware to call the clinic if symptoms persist or worsen. Patient is aware when to return to the clinic for a follow-up visit. Patient educated on when it is appropriate to go to the emergency department.   Time call ended: 11:38 AM  I provided 8 minutes of  non face-to-face time during this encounter.    Daryll Drown, NP

## 2021-01-02 LAB — SARS-COV-2, NAA 2 DAY TAT

## 2021-01-02 LAB — NOVEL CORONAVIRUS, NAA: SARS-CoV-2, NAA: DETECTED — AB

## 2021-01-07 ENCOUNTER — Encounter: Payer: Medicare Other | Attending: Family Medicine | Admitting: Nutrition

## 2021-01-07 ENCOUNTER — Encounter: Payer: Self-pay | Admitting: Nutrition

## 2021-01-07 ENCOUNTER — Other Ambulatory Visit: Payer: Self-pay

## 2021-01-07 VITALS — Ht 68.0 in | Wt 290.6 lb

## 2021-01-07 DIAGNOSIS — R7303 Prediabetes: Secondary | ICD-10-CM

## 2021-01-07 DIAGNOSIS — E66813 Obesity, class 3: Secondary | ICD-10-CM

## 2021-01-07 DIAGNOSIS — E782 Mixed hyperlipidemia: Secondary | ICD-10-CM

## 2021-01-07 DIAGNOSIS — I10 Essential (primary) hypertension: Secondary | ICD-10-CM

## 2021-01-07 NOTE — Patient Instructions (Addendum)
  Goals JoIn the gym near your house Work out three times per week for an hour each time Drink gallon of water per day Goal is to get 10,000 steps per day Lose 2-3 lbs per month.

## 2021-01-07 NOTE — Progress Notes (Signed)
Medical Nutrition Therapy:  Appt start time: 0930 end time: 1000  Assessment:  Primary concerns today: obesity and HTN. Had a gout flare up a few weeks ago and wasn't able to walk then. His gout is better now. He reports his blood pressure has been doing well. Wt stable. Wants to get weight down further. Plans to join gym and start working out a lot more. Diet is very good and he's making food food choices. He could benefit from increase fresh fruits and more low carb vegetables. A1C improved from 6.1 to 5.8%.  Wt Readings from Last 3 Encounters:  12/17/20 291 lb (132 kg)  11/20/20 291 lb (132 kg)  08/28/20 291 lb (132 kg)   Ht Readings from Last 3 Encounters:  12/17/20 5\' 8"  (1.727 m)  11/20/20 5\' 8"  (1.727 m)  08/28/20 5\' 8"  (1.727 m)   There is no height or weight on file to calculate BMI. @BMIFA @ Facility age limit for growth percentiles is 20 years. Facility age limit for growth percentiles is 20 years. CMP Latest Ref Rng & Units 11/20/2020 08/28/2020 02/28/2020  Glucose 65 - 99 mg/dL 95 95 )  BUN 6 - 20 mg/dL 10 13 12   Creatinine 0.76 - 1.27 mg/dL 11/22/2020 08/30/2020 03/01/2020  Sodium 134 - 144 mmol/L 142 139 144  Potassium 3.5 - 5.2 mmol/L 4.7 4.3 4.3  Chloride 96 - 106 mmol/L 109(H) 103 106  CO2 20 - 29 mmol/L 20 23 23   Calcium 8.7 - 10.2 mg/dL 8.8 9.0 542(H)  Total Protein 6.0 - 8.5 g/dL 6.5 6.7 6.5  Total Bilirubin 0.0 - 1.2 mg/dL 0.2 0.2  Alkaline Phos 44 - 121 IU/L 108 109 102  AST 0 - 40 IU/L 28 21 18   ALT 0 - 44 IU/L 37 28 29   Lab Results  Component Value Date   HGBA1C 5.8 11/20/2020   Lipid Panel     Component Value Date/Time   CHOL 166 11/20/2020 1428   TRIG 158 (H) 11/20/2020 1428   HDL 44 11/20/2020 1428   CHOLHDL 3.8 11/20/2020 1428   LDLCALC 94 11/20/2020 1428   LABVLDL 28 11/20/2020 1428     Preferred Learning Style:   No preference indicated   Learning Readiness:  Ready Change in progress   MEDICATIONS:   DIETARY INTAKE:  24-hr  recall:  B ( AM): 2 eggs sandwich and applesauce, water L ( PM): Cherrios, milk Snk ( PM):  D ( PM): Green beans, baked chicken and apple. Snk ( PM):  Beverages:  Water- 5-6 bottles per day'  Usual physical activity: walks alot  Estimated energy needs: 1800  calories 200 g carbohydrates 135 g protein 50 g fat  Progress Towards Goal(s):  In progress.   Nutritional Diagnosis:  NB-1.1 Food and nutrition-related knowledge deficit As related to Obesity.  As evidenced by Obesity.    Intervention:  Nutrition and  Pre Diabetes education provided on Plant based food choices and lifestyle medicine to improve his health and reduce co morbidities. Low purine diet for his gout.   Goals JoIn the gym near your house Work out three times per week for an hour each time Drink gallon of water per day Goal is to get 10,000 steps per day Lose 2-3 lbs per month.   Teaching Method Utilized:  Visual Auditory Hands on  Handouts given during visit include: The Plate Method Meal Plan   Barriers to learning/adherence to lifestyle change: none  Demonstrated degree of understanding via:  Teach  Back   Monitoring/Evaluation:  Dietary intake, exercise,  and body weight in 6 month(s).

## 2021-02-20 ENCOUNTER — Other Ambulatory Visit: Payer: Self-pay

## 2021-02-20 ENCOUNTER — Encounter: Payer: Self-pay | Admitting: Family Medicine

## 2021-02-20 ENCOUNTER — Ambulatory Visit (INDEPENDENT_AMBULATORY_CARE_PROVIDER_SITE_OTHER): Payer: Medicare Other | Admitting: Family Medicine

## 2021-02-20 VITALS — BP 138/88 | HR 75 | Wt 295.0 lb

## 2021-02-20 DIAGNOSIS — R7303 Prediabetes: Secondary | ICD-10-CM | POA: Diagnosis not present

## 2021-02-20 DIAGNOSIS — I10 Essential (primary) hypertension: Secondary | ICD-10-CM

## 2021-02-20 LAB — BAYER DCA HB A1C WAIVED: HB A1C (BAYER DCA - WAIVED): 6 % — ABNORMAL HIGH (ref 4.8–5.6)

## 2021-02-20 NOTE — Progress Notes (Signed)
BP 138/88   Pulse 75   Wt 295 lb (133.8 kg)   SpO2 95%   BMI 44.85 kg/m    Subjective:   Patient ID: Leonard Rivera, male    DOB: [birth year], 40 y.o.   MRN: [id number]  HPI: Leonard Rivera is a 40 y.o. male presenting on 02/20/2021 for Medical Management of Chronic Issues, Hypertension, and Hyperlipidemia   HPI Hypertension Patient is currently on amlodipine, and their blood pressure today is 138/88. Patient denies any lightheadedness or dizziness. Patient denies headaches, blurred vision, chest pains, shortness of breath, or weakness. Denies any side effects from medication and is content with current medication.   Prediabetes  Patient comes in today for recheck of his diabetes. Patient has been currently taking no medication currently. Patient is not currently on an ACE inhibitor/ARB. Patient has seen an ophthalmologist this year. Patient denies any issues with their feet. The symptom started onset as an adult hyperlipidemia and hypertension ARE RELATED TO DM   Hyperlipidemia Patient is coming in for recheck of his hyperlipidemia. The patient is currently taking atorvastatin. They deny any issues with myalgias or history of liver damage from it. They deny any focal numbness or weakness or chest pain.   Gout Last attack: More than a year ago Attacks this year: none Medication: allopurinol Location of attacks: feet   Relevant past medical, surgical, family and social history reviewed and updated as indicated. Interim medical history since our last visit reviewed. Allergies and medications reviewed and updated.  Review of Systems  Constitutional:  Negative for chills and fever.  Eyes:  Negative for visual disturbance.  Respiratory:  Negative for shortness of breath and wheezing.   Cardiovascular:  Negative for chest pain and leg swelling.  Musculoskeletal:  Negative for back pain and gait problem.  Skin:  Negative for rash.  Neurological:  Negative for dizziness, weakness and  light-headedness.  All other systems reviewed and are negative.  Per HPI unless specifically indicated above   Allergies as of 02/20/2021   No Known Allergies      Medication List        Accurate as of February 20, 2021 11:30 AM. If you have any questions, ask your nurse or doctor.          allopurinol 100 MG tablet Commonly known as: ZYLOPRIM Take 1 tablet (100 mg total) by mouth daily.   amLODipine 5 MG tablet Commonly known as: NORVASC Take 1 tablet (5 mg total) by mouth daily.   atorvastatin 20 MG tablet Commonly known as: LIPITOR Take 1 tablet (20 mg total) by mouth at bedtime.   cetirizine 10 MG tablet Commonly known as: ZYRTEC Take 10 mg by mouth daily.   cyclobenzaprine 10 MG tablet Commonly known as: FLEXERIL Take 1 tablet (10 mg total) by mouth 3 (three) times daily.         Objective:   BP 138/88   Pulse 75   Wt 295 lb (133.8 kg)   SpO2 95%   BMI 44.85 kg/m   Wt Readings from Last 3 Encounters:  02/20/21 295 lb (133.8 kg)  01/07/21 290 lb 9.6 oz (131.8 kg)  12/17/20 291 lb (132 kg)    Physical Exam Vitals and nursing note reviewed.  Constitutional:      General: He is not in acute distress.    Appearance: He is well-developed. He is not diaphoretic.  Eyes:     General: No scleral icterus.    Conjunctiva/sclera: Conjunctivae normal.  Neck:  Thyroid: No thyromegaly.  Cardiovascular:     Rate and Rhythm: Normal rate and regular rhythm.     Heart sounds: Normal heart sounds. No murmur heard. Pulmonary:     Effort: Pulmonary effort is normal. No respiratory distress.     Breath sounds: Normal breath sounds. No wheezing.  Musculoskeletal:        General: Normal range of motion.     Cervical back: Neck supple.  Lymphadenopathy:     Cervical: No cervical adenopathy.  Skin:    General: Skin is warm and dry.     Findings: No rash.  Neurological:     Mental Status: He is alert and oriented to person, place, and time.      Coordination: Coordination normal.  Psychiatric:        Behavior: Behavior normal.      Assessment & Plan:   Problem List Items Addressed This Visit       Cardiovascular and Mediastinum   Hypertension - Primary     Other   Morbid obesity (HCC)   Prediabetes   Relevant Orders   Bayer DCA Hb A1c Waived    Continue current medications, will check A1c on the way out and watch for it. Follow up plan: Return in about 3 months (around 05/23/2021), or if symptoms worsen or fail to improve, for htn and prediabetes.  Counseling provided for all of the vaccine components Orders Placed This Encounter  Procedures   Bayer DCA Hb A1c Waived    Arville Care, MD [facility] 02/20/2021, 11:30 AM

## 2021-03-02 DIAGNOSIS — M1000 Idiopathic gout, unspecified site: Secondary | ICD-10-CM | POA: Diagnosis not present

## 2021-03-02 DIAGNOSIS — M25571 Pain in right ankle and joints of right foot: Secondary | ICD-10-CM | POA: Diagnosis not present

## 2021-03-02 DIAGNOSIS — M25532 Pain in left wrist: Secondary | ICD-10-CM | POA: Diagnosis not present

## 2021-05-19 ENCOUNTER — Telehealth: Payer: Self-pay | Admitting: Family Medicine

## 2021-05-19 DIAGNOSIS — R7303 Prediabetes: Secondary | ICD-10-CM

## 2021-05-19 NOTE — Telephone Encounter (Signed)
Barbara from [facility] is calling to request a new referral for patient. His previous one has expired. He has appointment on 06/09/21 and they will have to cancel if they do not get this asap.     REFERRAL REQUEST Telephone Note  Have you been seen at our office for this problem? yes (Advise that they may need an appointment with their PCP before a referral can be done)  Reason for Referral: Diabetes / Overweight  Referral discussed with patient: yes  Best contact number of patient for referral team: [phone]    Has patient been seen by a specialist for this issue before: yes  Patient provider preference for referral: [facility] Patient location preference for referral: as above   Patient notified that referrals can take up to a week or longer to process. If they haven't heard anything within a week they should call back and speak with the referral department.

## 2021-05-25 ENCOUNTER — Ambulatory Visit (INDEPENDENT_AMBULATORY_CARE_PROVIDER_SITE_OTHER): Payer: Medicare Other | Admitting: Family Medicine

## 2021-05-25 ENCOUNTER — Encounter: Payer: Self-pay | Admitting: Family Medicine

## 2021-05-25 VITALS — BP 147/93 | HR 84 | Ht 68.0 in | Wt 304.0 lb

## 2021-05-25 DIAGNOSIS — I10 Essential (primary) hypertension: Secondary | ICD-10-CM | POA: Diagnosis not present

## 2021-05-25 DIAGNOSIS — R7303 Prediabetes: Secondary | ICD-10-CM | POA: Diagnosis not present

## 2021-05-25 LAB — BAYER DCA HB A1C WAIVED: HB A1C (BAYER DCA - WAIVED): 6.1 % — ABNORMAL HIGH (ref 4.8–5.6)

## 2021-05-25 MED ORDER — AMLODIPINE BESYLATE 10 MG PO TABS: 10.0000 mg | ORAL_TABLET | Freq: Every day | ORAL | 3 refills | Status: DC

## 2021-05-25 NOTE — Telephone Encounter (Signed)
Placed referral  

## 2021-05-25 NOTE — Progress Notes (Signed)
BP (!) 147/93    Pulse 84    Ht _0  (1.727 m)    Wt (!) 304 lb (137.9 kg)    SpO2 97%    BMI 46.22 kg/m    Subjective:   Patient ID: Leonard Rivera, male    DOB: [birth year], 41 y.o.   MRN: [id number]  HPI: Leonard Rivera is a 41 y.o. male presenting on 05/25/2021 for Medical Management of Chronic Issues and Hypertension   HPI Hypertension Patient is currently on amlodipine, and their blood pressure today is 147/93. Patient denies any lightheadedness or dizziness. Patient denies headaches, blurred vision, chest pains, shortness of breath, or weakness. Denies any side effects from medication and is content with current medication.   Type 2 diabetes mellitus Patient comes in today for recheck of his diabetes. Patient has been currently taking no medication, has been diet controlled prediabetes. Patient is not currently on an ACE inhibitor/ARB. Patient has not seen an ophthalmologist this year. Patient denies any issues with their feet. The symptom started onset as an adult hypertension and obesity ARE RELATED TO DM   Patient's diabetes and hypertension are more complicated by the patient's morbid obesity.  Discussed weight loss and lifestyle modification and exercise with the patient.   Relevant past medical, surgical, family and social history reviewed and updated as indicated. Interim medical history since our last visit reviewed. Allergies and medications reviewed and updated.  Review of Systems  Constitutional:  Negative for chills and fever.  Eyes:  Negative for visual disturbance.  Respiratory:  Negative for shortness of breath and wheezing.   Cardiovascular:  Negative for chest pain and leg swelling.  Musculoskeletal:  Negative for back pain and gait problem.  Skin:  Negative for rash.  Neurological:  Negative for dizziness, weakness and light-headedness.  All other systems reviewed and are negative.  Per HPI unless specifically indicated above   Allergies as of 05/25/2021    No Known Allergies      Medication List        Accurate as of May 25, 2021  1:17 PM. If you have any questions, ask your nurse or doctor.          allopurinol 100 MG tablet Commonly known as: ZYLOPRIM Take 1 tablet (100 mg total) by mouth daily.   amLODipine 5 MG tablet Commonly known as: NORVASC Take 1 tablet (5 mg total) by mouth daily.   atorvastatin 20 MG tablet Commonly known as: LIPITOR Take 1 tablet (20 mg total) by mouth at bedtime.   cetirizine 10 MG tablet Commonly known as: ZYRTEC Take 10 mg by mouth daily.   cyclobenzaprine 10 MG tablet Commonly known as: FLEXERIL Take 1 tablet (10 mg total) by mouth 3 (three) times daily.         Objective:   BP (!) 147/93    Pulse 84    Ht _1  (1.727 m)    Wt (!) 304 lb (137.9 kg)    SpO2 97%    BMI 46.22 kg/m   Wt Readings from Last 3 Encounters:  05/25/21 (!) 304 lb (137.9 kg)  02/20/21 295 lb (133.8 kg)  01/07/21 290 lb 9.6 oz (131.8 kg)    Physical Exam Vitals and nursing note reviewed.  Constitutional:      General: He is not in acute distress.    Appearance: He is well-developed. He is not diaphoretic.  Eyes:     General: No scleral icterus.    Conjunctiva/sclera: Conjunctivae normal.  Neck:     Thyroid: No thyromegaly.  Cardiovascular:     Rate and Rhythm: Normal rate and regular rhythm.     Heart sounds: Normal heart sounds. No murmur heard. Pulmonary:     Effort: Pulmonary effort is normal. No respiratory distress.     Breath sounds: Normal breath sounds. No wheezing.  Musculoskeletal:     Cervical back: Neck supple.  Lymphadenopathy:     Cervical: No cervical adenopathy.  Skin:    General: Skin is warm and dry.     Findings: No rash.  Neurological:     Mental Status: He is alert and oriented to person, place, and time.     Coordination: Coordination normal.  Psychiatric:        Behavior: Behavior normal.    Results for orders placed or performed in visit on 02/20/21  Bayer  DCA Hb A1c Waived  Result Value Ref Range   HB A1C (BAYER DCA - WAIVED) 6.0 (H) 4.8 - 5.6 %    Assessment & Plan:   Problem List Items Addressed This Visit       Cardiovascular and Mediastinum   Hypertension - Primary   Relevant Orders   CBC with Differential/Platelet   CMP14+EGFR     Other   Prediabetes   Relevant Orders   Bayer DCA Hb A1c Waived   Morbid obesity ([place])   Relevant Orders   Lipid panel     Increase amlodipine to 10 mg to help with blood pressure.  Will check blood work today.  No other changes at this point Follow up plan: Return in about 3 months (around 08/23/2021), or if symptoms worsen or fail to improve, for Prediabetes and hypertension.  Counseling provided for all of the vaccine components Orders Placed This Encounter  Procedures   Bayer [place] Hb A1c Waived   CBC with Differential/Platelet   CMP14+EGFR   Lipid panel    Caryl Pina, MD [facility] 05/25/2021, 1:17 PM

## 2021-05-26 LAB — CMP14+EGFR
ALT: 29 IU/L (ref 0–44)
AST: 21 IU/L (ref 0–40)
Albumin/Globulin Ratio: 1.7 (ref 1.2–2.2)
Albumin: 4.3 g/dL (ref 4.0–5.0)
Alkaline Phosphatase: 110 IU/L (ref 44–121)
BUN/Creatinine Ratio: 18 (ref 9–20)
BUN: 16 mg/dL (ref 6–24)
Bilirubin Total: <0.2 mg/dL (ref 0.0–1.2)
CO2: 22 mmol/L (ref 20–29)
Calcium: 9.3 mg/dL (ref 8.7–10.2)
Chloride: 107 mmol/L — ABNORMAL HIGH (ref 96–106)
Creatinine, Ser: 0.91 mg/dL (ref 0.76–1.27)
Globulin, Total: 2.5 g/dL (ref 1.5–4.5)
Glucose: 114 mg/dL — ABNORMAL HIGH (ref 70–99)
Potassium: 5.2 mmol/L (ref 3.5–5.2)
Sodium: 143 mmol/L (ref 134–144)
Total Protein: 6.8 g/dL (ref 6.0–8.5)
eGFR: 109 mL/min/{1.73_m2} (ref >59)

## 2021-05-26 LAB — LIPID PANEL
Chol/HDL Ratio: 4.8 ratio (ref 0.0–5.0)
Cholesterol, Total: 209 mg/dL — ABNORMAL HIGH (ref 100–199)
HDL: 44 mg/dL (ref >39)
LDL Chol Calc (NIH): 128 mg/dL — ABNORMAL HIGH (ref 0–99)
Triglycerides: 210 mg/dL — ABNORMAL HIGH (ref 0–149)
VLDL Cholesterol Cal: 37 mg/dL (ref 5–40)

## 2021-05-26 LAB — CBC WITH DIFFERENTIAL/PLATELET
Basophils Absolute: 0.1 10*3/uL (ref 0.0–0.2)
Basos: 1 %
EOS (ABSOLUTE): 0.4 10*3/uL (ref 0.0–0.4)
Eos: 4 %
Hematocrit: 50.5 % (ref 37.5–51.0)
Hemoglobin: 17.2 g/dL (ref 13.0–17.7)
Immature Grans (Abs): 0 10*3/uL (ref 0.0–0.1)
Immature Granulocytes: 0 %
Lymphocytes Absolute: 3.1 10*3/uL (ref 0.7–3.1)
Lymphs: 33 %
MCH: 29.6 pg (ref 26.6–33.0)
MCHC: 34.1 g/dL (ref 31.5–35.7)
MCV: 87 fL (ref 79–97)
Monocytes Absolute: 0.7 10*3/uL (ref 0.1–0.9)
Monocytes: 7 %
Neutrophils Absolute: 5.2 10*3/uL (ref 1.4–7.0)
Neutrophils: 55 %
Platelets: 306 10*3/uL (ref 150–450)
RBC: 5.81 x10E6/uL — ABNORMAL HIGH (ref 4.14–5.80)
RDW: 13.3 % (ref 11.6–15.4)
WBC: 9.5 10*3/uL (ref 3.4–10.8)

## 2021-05-27 ENCOUNTER — Ambulatory Visit: Payer: Medicare Other | Admitting: Nutrition

## 2021-06-08 ENCOUNTER — Other Ambulatory Visit: Payer: Self-pay

## 2021-06-08 MED ORDER — ATORVASTATIN CALCIUM 40 MG PO TABS: 40.0000 mg | ORAL_TABLET | Freq: Every evening | ORAL | 1 refills | Status: DC

## 2021-06-09 ENCOUNTER — Other Ambulatory Visit: Payer: Self-pay

## 2021-06-09 ENCOUNTER — Ambulatory Visit: Payer: Medicare Other | Admitting: Nutrition

## 2021-06-16 DIAGNOSIS — M1000 Idiopathic gout, unspecified site: Secondary | ICD-10-CM | POA: Diagnosis not present

## 2021-06-25 ENCOUNTER — Encounter: Payer: Self-pay | Admitting: Nutrition

## 2021-06-25 ENCOUNTER — Encounter: Payer: Medicare Other | Attending: Family Medicine | Admitting: Nutrition

## 2021-06-25 VITALS — Ht 68.0 in | Wt 304.0 lb

## 2021-06-25 DIAGNOSIS — E782 Mixed hyperlipidemia: Secondary | ICD-10-CM | POA: Insufficient documentation

## 2021-06-25 DIAGNOSIS — Z713 Dietary counseling and surveillance: Secondary | ICD-10-CM | POA: Diagnosis not present

## 2021-06-25 DIAGNOSIS — R7303 Prediabetes: Secondary | ICD-10-CM | POA: Diagnosis not present

## 2021-06-25 DIAGNOSIS — I10 Essential (primary) hypertension: Secondary | ICD-10-CM | POA: Insufficient documentation

## 2021-06-25 NOTE — Progress Notes (Signed)
Medical Nutrition Therapy:  Appt start time: 0930 end time: 1000  Assessment:  Primary concerns today: obesity and HTN.  Has a bone spur in his right foot. Got a steroid injection. It feels some better he notes. Hasn't been able to walk as much. Gained 9 lbs due to not being able to walk. Doesn't like having to stay inside. A1C up to 6.1% from 6%.  Wt Readings from Last 3 Encounters:  05/25/21 (!) 304 lb (137.9 kg)  02/20/21 295 lb (133.8 kg)  01/07/21 290 lb 9.6 oz (131.8 kg)   Ht Readings from Last 3 Encounters:  05/25/21 5\' 8"  (1.727 m)  01/07/21 5\' 8"  (1.727 m)  12/17/20 5\' 8"  (1.727 m)   There is no height or weight on file to calculate BMI. @BMIFA @ Facility age limit for growth percentiles is 20 years. Facility age limit for growth percentiles is 20 years. CMP Latest Ref Rng & Units 05/25/2021 11/20/2020 08/28/2020  Glucose 70 - 99 mg/dL ) 95 95  BUN 6 - 24 mg/dL 16 10 13   Creatinine 0.76 - 1.27 mg/dL 05/27/2021 11/22/2020 08/30/2020  Sodium 134 - 144 mmol/L 143 142 139  Potassium 3.5 - 5.2 mmol/L 5.2 4.7 4.3  Chloride 96 - 106 mmol/L 107(H) 109(H) 103  CO2 20 - 29 mmol/L 22 20 23   Calcium 8.7 - 10.2 mg/dL 9.3 8.8 9.0  Total Protein 6.0 - 8.5 g/dL 6.8 6.5 6.7  Total Bilirubin 0.0 - 1.2 mg/dL 683(M 0.2 0.2  Alkaline Phos 44 - 121 IU/L 110 108 109  AST 0 - 40 IU/L 21 28 21   ALT 0 - 44 IU/L 29 37 28   Lab Results  Component Value Date   HGBA1C 6.1 (H) 05/25/2021   Lipid Panel     Component Value Date/Time   CHOL 209 (H) 05/25/2021 1328   TRIG 210 (H) 05/25/2021 1328   HDL 44 05/25/2021 1328   CHOLHDL 4.8 05/25/2021 1328   LDLCALC 128 (H) 05/25/2021 1328   LABVLDL 37 05/25/2021 1328     Preferred Learning Style:   No preference indicated   Learning Readiness:  Ready Change in progress   MEDICATIONS:   DIETARY INTAKE:  24-hr recall:  B ( AM): 2 eggs sandwich and apple water L ( PM): Cherrios, milk Snk ( PM):  D ( PM): Green beans, baked chicken  Snk ( PM):   Beverages:  Water- 5-6 bottles per day'  Usual physical activity: walks alot  Estimated energy needs: 1800  calories 200 g carbohydrates 135 g protein 50 g fat  Progress Towards Goal(s):  In progress.   Nutritional Diagnosis:  NB-1.1 Food and nutrition-related knowledge deficit As related to Obesity.  As evidenced by Obesity.    Intervention:  Nutrition and  Pre Diabetes education provided on Plant based food choices and lifestyle medicine to improve his health and reduce co morbidities. Low purine diet for his gout.   High Fiber diet and plant based foods.   Goals  Do chair exercises daily for 30 minutes. Drink only water-a gallon a day. Get back to walking when able.  Continue to increase fresh fruits and vegetables. Lose 1 lb per week    Lifestyle Medicine - Whole Food, Plant Predominant Nutrition is highly recommended: Eat Plenty of vegetables, Mushrooms, fruits, Legumes, Whole Grains, Nuts, seeds in lieu of processed meats, processed snacks/pastries red meat, poultry, eggs.    -It is better to avoid simple carbohydrates including: Cakes, Sweet Desserts, Ice Cream, Soda (diet and  regular), Sweet Tea, Candies, Chips, Cookies, Store Bought Juices, Alcohol in Excess of  1-2 drinks a day, Lemonade,  Artificial Sweeteners, Doughnuts, Coffee Creamers, "Sugar-free" Products, etc, etc.  This is not a complete list.....  Exercise: If you are able: 30 -60 minutes a day ,4 days a week, or 150 minutes a week.  The longer the better.  Combine stretch, strength, and aerobic activities.  If you were told in the past that you have high risk for cardiovascular diseases, you may seek evaluation by your heart doctor prior to initiating moderate to intense exercise programs.   Teaching Method Utilized:  Visual Auditory Hands on  Handouts given during visit include: The Plate Method Meal Plan   Barriers to learning/adherence to lifestyle change: none  Demonstrated degree of  understanding via:  Teach Back   Monitoring/Evaluation:  Dietary intake, exercise,  and body weight in 3 month(s).  Recommend to consider prescribing a GLP-1 for needed weight loss as he tries to implement lifestyle changes.

## 2021-06-25 NOTE — Patient Instructions (Addendum)
Goals  Do chair exercises daily for 30 minutes. Drink only water-a gallon a day. Get back to walking when able.  Continue to increase fresh fruits and vegetables. Lose 1 lb per week    Lifestyle Medicine - Whole Food, Plant Predominant Nutrition is highly recommended: Eat Plenty of vegetables, Mushrooms, fruits, Legumes, Whole Grains, Nuts, seeds in lieu of processed meats, processed snacks/pastries red meat, poultry, eggs.    -It is better to avoid simple carbohydrates including: Cakes, Sweet Desserts, Ice Cream, Soda (diet and regular), Sweet Tea, Candies, Chips, Cookies, Store Bought Juices, Alcohol in Excess of  1-2 drinks a day, Lemonade,  Artificial Sweeteners, Doughnuts, Coffee Creamers, "Sugar-free" Products, etc, etc.  This is not a complete list.....  Exercise: If you are able: 30 -60 minutes a day ,4 days a week, or 150 minutes a week.  The longer the better.  Combine stretch, strength, and aerobic activities.  If you were told in the past that you have high risk for cardiovascular diseases, you may seek evaluation by your heart doctor prior to initiating moderate to intense exercise programs.

## 2021-08-31 ENCOUNTER — Ambulatory Visit (INDEPENDENT_AMBULATORY_CARE_PROVIDER_SITE_OTHER): Payer: Medicare Other | Admitting: Family Medicine

## 2021-08-31 ENCOUNTER — Encounter: Payer: Self-pay | Admitting: Family Medicine

## 2021-08-31 VITALS — BP 139/79 | HR 75 | Ht 68.0 in | Wt 305.0 lb

## 2021-08-31 DIAGNOSIS — I10 Essential (primary) hypertension: Secondary | ICD-10-CM | POA: Diagnosis not present

## 2021-08-31 DIAGNOSIS — M109 Gout, unspecified: Secondary | ICD-10-CM | POA: Diagnosis not present

## 2021-08-31 DIAGNOSIS — R7303 Prediabetes: Secondary | ICD-10-CM

## 2021-08-31 LAB — BAYER DCA HB A1C WAIVED: HB A1C (BAYER DCA - WAIVED): 6.1 % — ABNORMAL HIGH (ref 4.8–5.6)

## 2021-08-31 MED ORDER — ALLOPURINOL 100 MG PO TABS: 100.0000 mg | ORAL_TABLET | Freq: Every day | ORAL | 3 refills | Status: DC

## 2021-08-31 MED ORDER — ATORVASTATIN CALCIUM 40 MG PO TABS: 40.0000 mg | ORAL_TABLET | Freq: Every evening | ORAL | 1 refills | Status: DC

## 2021-08-31 NOTE — Progress Notes (Signed)
? ?BP 139/79   Pulse 75   Ht 5\' 8"  (1.727 m)   Wt (!) 305 lb (138.3 kg)   SpO2 95%   BMI 46.38 kg/m?   ? ?Subjective:  ? ?Patient ID: Leonard Rivera, male    DOB: May 09, 1980, 41 y.o.   MRN: [id number] ? ?HPI: ?Leonard Rivera is a 41 y.o. male presenting on 08/31/2021 for Medical Management of Chronic Issues, Hyperlipidemia, Hypertension, and Prediabetes ? ? ?HPI ?Prediabetes  ?patient comes in today for recheck of his diabetes. Patient has been currently taking no medicine, diet controlled, A1c looks good at 6.1. Patient is not currently on an ACE inhibitor/ARB. Patient has not seen an ophthalmologist this year. Patient denies any issues with their feet. The symptom started onset as an adult hypertension and hyperlipidemia ARE RELATED TO DM  ? ?Hypertension ?Patient is currently on amlodipine, and their blood pressure today is 139/79. Patient denies any lightheadedness or dizziness. Patient denies headaches, blurred vision, chest pains, shortness of breath, or weakness. Denies any side effects from medication and is content with current medication.  ? ?Hyperlipidemia ?Patient is coming in for recheck of his hyperlipidemia. The patient is currently taking atorvastatin. They deny any issues with myalgias or history of liver damage from it. They deny any focal numbness or weakness or chest pain.  ? ?Gout ?Last attack: More than a year ago ?Attacks this year: None ?Medication: Allopurinol ?Location of attacks: Right foot ? ?Relevant past medical, surgical, family and social history reviewed and updated as indicated. Interim medical history since our last visit reviewed. ?Allergies and medications reviewed and updated. ? ?Review of Systems  ?Constitutional:  Negative for chills and fever.  ?Eyes:  Negative for visual disturbance.  ?Respiratory:  Negative for shortness of breath and wheezing.   ?Cardiovascular:  Negative for chest pain and leg swelling.  ?Musculoskeletal:  Negative for back pain and gait problem.  ?Skin:   Negative for rash.  ?Neurological:  Negative for dizziness, weakness and light-headedness.  ?All other systems reviewed and are negative. ? ?Per HPI unless specifically indicated above ? ? ?Allergies as of 08/31/2021   ?No Known Allergies ?  ? ?  ?Medication List  ?  ? ?  ? Accurate as of Aug 31, 2021  2:48 PM. If you have any questions, ask your nurse or doctor.  ?  ?  ? ?  ? ?allopurinol 100 MG tablet ?Commonly known as: ZYLOPRIM ?Take 1 tablet (100 mg total) by mouth daily. ?  ?amLODipine 10 MG tablet ?Commonly known as: NORVASC ?Take 1 tablet (10 mg total) by mouth daily. ?  ?atorvastatin 40 MG tablet ?Commonly known as: LIPITOR ?Take 1 tablet (40 mg total) by mouth at bedtime. ?  ?cetirizine 10 MG tablet ?Commonly known as: ZYRTEC ?Take 10 mg by mouth daily. ?  ?cyclobenzaprine 10 MG tablet ?Commonly known as: FLEXERIL ?Take 1 tablet (10 mg total) by mouth 3 (three) times daily. ?  ? ?  ? ? ? ?Objective:  ? ?BP 139/79   Pulse 75   Ht 5\' 8"  (1.727 m)   Wt (!) 305 lb (138.3 kg)   SpO2 95%   BMI 46.38 kg/m?   ?Wt Readings from Last 3 Encounters:  ?08/31/21 (!) 305 lb (138.3 kg)  ?06/25/21 (!) 304 lb (137.9 kg)  ?05/25/21 (!) 304 lb (137.9 kg)  ?  ?Physical Exam ?Vitals and nursing note reviewed.  ?Constitutional:   ?   General: He is not in acute distress. ?  Appearance: He is well-developed. He is not diaphoretic.  ?Eyes:  ?   General: No scleral icterus. ?   Conjunctiva/sclera: Conjunctivae normal.  ?Neck:  ?   Thyroid: No thyromegaly.  ?Cardiovascular:  ?   Rate and Rhythm: Normal rate and regular rhythm.  ?   Heart sounds: Normal heart sounds. No murmur heard. ?Pulmonary:  ?   Effort: Pulmonary effort is normal. No respiratory distress.  ?   Breath sounds: Normal breath sounds. No wheezing.  ?Musculoskeletal:     ?   General: No swelling. Normal range of motion.  ?   Cervical back: Neck supple.  ?Lymphadenopathy:  ?   Cervical: No cervical adenopathy.  ?Skin: ?   General: Skin is warm and dry.  ?    Findings: No rash.  ?Neurological:  ?   Mental Status: He is alert and oriented to person, place, and time.  ?   Coordination: Coordination normal.  ?Psychiatric:     ?   Behavior: Behavior normal.  ? ? ? ? ?Assessment & Plan:  ? ?Problem List Items Addressed This Visit   ? ?  ? Cardiovascular and Mediastinum  ? Hypertension  ? Relevant Medications  ? atorvastatin (LIPITOR) 40 MG tablet  ?  ? Other  ? Prediabetes - Primary  ? Relevant Orders  ? Bayer DCA Hb A1c Waived  ? Gout  ?  ?A1c looks good at 6.1, blood pressure looks good, no changes, continue current medicine. ?Follow up plan: ?Return in about 3 months (around 12/01/2021), or if symptoms worsen or fail to improve, for Prediabetes hypertension and gout. ? ?Counseling provided for all of the vaccine components ?Orders Placed This Encounter  ?Procedures  ? Bayer DCA Hb A1c Waived  ? ? ?Arville Care, MD ?[facility] ?08/31/2021, 2:48 PM ? ? ? ? ?

## 2021-09-22 ENCOUNTER — Encounter: Payer: Self-pay | Admitting: Nutrition

## 2021-09-22 ENCOUNTER — Encounter: Payer: Medicare Other | Attending: Family Medicine | Admitting: Nutrition

## 2021-09-22 VITALS — Ht 68.0 in | Wt 302.8 lb

## 2021-09-22 DIAGNOSIS — E782 Mixed hyperlipidemia: Secondary | ICD-10-CM | POA: Insufficient documentation

## 2021-09-22 DIAGNOSIS — I10 Essential (primary) hypertension: Secondary | ICD-10-CM | POA: Insufficient documentation

## 2021-09-22 DIAGNOSIS — R7303 Prediabetes: Secondary | ICD-10-CM | POA: Insufficient documentation

## 2021-09-22 NOTE — Progress Notes (Signed)
Medical Nutrition Therapy:  Appt start time: 0930 end time: 0945  Assessment:  Primary concerns today: PRe DM, obesity and HTN.  Bone spur is better and now walking daily. Lost 3 lbs since last visit. Has been helping a friend do some landscaping. Has been eating more fruits, vegetables. Cut out junk food. Feels much better.   Checks BP at home and have been better.  A1c  6.1%  STABLE. He is making progress. He would benefit from a GLP-1 for needed weight loss and improved blood sugar control.   Wt Readings from Last 3 Encounters:  09/22/21 (!) 302 lb 12.8 oz (137.3 kg)  08/31/21 (!) 305 lb (138.3 kg)  06/25/21 (!) 304 lb (137.9 kg)   Ht Readings from Last 3 Encounters:  09/22/21 5\' 8"  (1.727 m)  08/31/21 5\' 8"  (1.727 m)  06/25/21 5\' 8"  (1.727 m)   Body mass index is 46.04 kg/m. @BMIFA @ Facility age limit for growth percentiles is 20 years. Facility age limit for growth percentiles is 20 years.    Latest Ref Rng & Units 05/25/2021    1:28 PM 11/20/2020    2:28 PM 08/28/2020    2:46 PM  CMP  Glucose 70 - 99 mg/dL 114   95   95    BUN 6 - 24 mg/dL 16   10   13     Creatinine 0.76 - 1.27 mg/dL 0.91   0.93   0.96    Sodium 134 - 144 mmol/L 143   142   139    Potassium 3.5 - 5.2 mmol/L 5.2   4.7   4.3    Chloride 96 - 106 mmol/L 107   109   103    CO2 20 - 29 mmol/L 22   20   23     Calcium 8.7 - 10.2 mg/dL 9.3   8.8   9.0    Total Protein 6.0 - 8.5 g/dL 6.8   6.5   6.7    Total Bilirubin 0.0 - 1.2 mg/dL <0.2   0.2   0.2    Alkaline Phos 44 - 121 IU/L 110   108   109    AST 0 - 40 IU/L 21   28   21     ALT 0 - 44 IU/L 29   37   28     Lab Results  Component Value Date   HGBA1C 6.1 (H) 08/31/2021   Lipid Panel     Component Value Date/Time   CHOL 209 (H) 05/25/2021 1328   TRIG 210 (H) 05/25/2021 1328   HDL 44 05/25/2021 1328   CHOLHDL 4.8 05/25/2021 1328   LDLCALC 128 (H) 05/25/2021 1328   LABVLDL 37 05/25/2021 1328     Preferred Learning Style:   No  preference indicated   Learning Readiness:  Ready Change in progress   MEDICATIONS:   DIETARY INTAKE:  24-hr recall:  B ( AM): Egg sandwich, and apple, water L ( PM): Baked chicken sandwich, water Snk ( PM):  D ( PM): Ham sandwich water Snk ( PM):  Beverages:  Water- 5-6 bottles per day'  Usual physical activity: walks alot  Estimated energy needs: 1800  calories 200 g carbohydrates 135 g protein 50 g fat  Progress Towards Goal(s):  In progress.   Nutritional Diagnosis:  NB-1.1 Food and nutrition-related knowledge deficit As related to Obesity.  As evidenced by Obesity.    Intervention:  Nutrition and  Pre Diabetes education provided on Plant based  food choices and lifestyle medicine to improve his health and reduce co morbidities. Low purine diet for his gout.   High Fiber diet and plant based foods.   Goals   Eat a piece of fruit with each meals Add vegetables to lunch and supper Put the walking  app back on your phone.  Try to get in 10,000 steps per day. Lose a pound a week.     Lifestyle Medicine - Whole Food, Plant Predominant Nutrition is highly recommended: Eat Plenty of vegetables, Mushrooms, fruits, Legumes, Whole Grains, Nuts, seeds in lieu of processed meats, processed snacks/pastries red meat, poultry, eggs.    -It is better to avoid simple carbohydrates including: Cakes, Sweet Desserts, Ice Cream, Soda (diet and regular), Sweet Tea, Candies, Chips, Cookies, Store Bought Juices, Alcohol in Excess of  1-2 drinks a day, Lemonade,  Artificial Sweeteners, Doughnuts, Coffee Creamers, "Sugar-free" Products, etc, etc.  This is not a complete list.....  Exercise: If you are able: 30 -60 minutes a day ,4 days a week, or 150 minutes a week.  The longer the better.  Combine stretch, strength, and aerobic activities.  If you were told in the past that you have high risk for cardiovascular diseases, you may seek evaluation by your heart doctor prior to  initiating moderate to intense exercise programs.   Teaching Method Utilized:  Visual Auditory Hands on  Handouts given during visit include: The Plate Method Meal Plan   Barriers to learning/adherence to lifestyle change: none  Demonstrated degree of understanding via:  Teach Back   Monitoring/Evaluation:  Dietary intake, exercise,  and body weight in 3 month(s).  Recommend to consider prescribing a GLP-1 for needed weight loss as he tries to implement lifestyle changes.

## 2021-09-22 NOTE — Patient Instructions (Addendum)
Goals  Eat a piece of fruit with each meals Add vegetables to lunch and supper Put the walking  app back on your phone.  Try to get in 10,000 steps per day. Lose a pound a week.

## 2021-12-02 ENCOUNTER — Encounter: Payer: Self-pay | Admitting: Family Medicine

## 2021-12-02 ENCOUNTER — Ambulatory Visit (INDEPENDENT_AMBULATORY_CARE_PROVIDER_SITE_OTHER): Payer: Medicare Other | Admitting: Family Medicine

## 2021-12-02 VITALS — BP 127/72 | HR 84 | Temp 98.4°F | Ht 68.0 in | Wt 315.0 lb

## 2021-12-02 DIAGNOSIS — R7303 Prediabetes: Secondary | ICD-10-CM | POA: Diagnosis not present

## 2021-12-02 DIAGNOSIS — E1169 Type 2 diabetes mellitus with other specified complication: Secondary | ICD-10-CM | POA: Insufficient documentation

## 2021-12-02 DIAGNOSIS — I10 Essential (primary) hypertension: Secondary | ICD-10-CM | POA: Diagnosis not present

## 2021-12-02 DIAGNOSIS — E7800 Pure hypercholesterolemia, unspecified: Secondary | ICD-10-CM | POA: Diagnosis not present

## 2021-12-02 DIAGNOSIS — M109 Gout, unspecified: Secondary | ICD-10-CM | POA: Diagnosis not present

## 2021-12-02 LAB — BAYER DCA HB A1C WAIVED: HB A1C (BAYER DCA - WAIVED): 6.3 % — ABNORMAL HIGH (ref 4.8–5.6)

## 2021-12-02 MED ORDER — CYCLOBENZAPRINE HCL 10 MG PO TABS
10.0000 mg | ORAL_TABLET | Freq: Three times a day (TID) | ORAL | 2 refills | Status: DC
Start: 2021-12-02 — End: 2022-11-30

## 2021-12-02 NOTE — Progress Notes (Signed)
BP 127/72   Pulse 84   Temp 98.4 F (36.9 C)   Ht '5\' 8"'  (1.727 m)   Wt (!) 315 lb (142.9 kg)   SpO2 95%   BMI 47.90 kg/m    Subjective:   Patient ID: Leonard Rivera, male    DOB: 06-Dec-1980, 41 y.o.   MRN: [id number]  HPI: Leonard Rivera is a 41 y.o. male presenting on 12/02/2021 for Medical Management of Chronic Issues, Hypertension, and Prediabetes   HPI Prediabetes  patient comes in today for recheck of his diabetes. Patient has been currently taking no medicine currently, diet control. Patient is not currently on an ACE inhibitor/ARB. Patient has not seen an ophthalmologist this year. Patient denies any issues with their feet. The symptom started onset as an adult hypertension and morbid obesity ARE RELATED TO DM   Hypertension Patient is currently on amlodipine, and their blood pressure today is 127/72. Patient denies any lightheadedness or dizziness. Patient denies headaches, blurred vision, chest pains, shortness of breath, or weakness. Denies any side effects from medication and is content with current medication.   Hyperlipidemia Patient is coming in for recheck of his hyperlipidemia. The patient is currently taking Lipitor. They deny any issues with myalgias or history of liver damage from it. They deny any focal numbness or weakness or chest pain.   Patient's hypertension and hyperlipidemia and prediabetes are more complicated by the patient's morbid obesity.  Discussed weight loss and lifestyle modification and exercise with the patient.   Gout Last attack: Well more than a year Attacks this year: None Medication: Allopurinol Location of attacks: Feet and knees  Relevant past medical, surgical, family and social history reviewed and updated as indicated. Interim medical history since our last visit reviewed. Allergies and medications reviewed and updated.  Review of Systems  Constitutional:  Negative for chills and fever.  Eyes:  Negative for visual disturbance.   Respiratory:  Negative for shortness of breath and wheezing.   Cardiovascular:  Negative for chest pain and leg swelling.  Musculoskeletal:  Negative for back pain and gait problem.  Skin:  Negative for rash.  Neurological:  Negative for dizziness, weakness and light-headedness.  All other systems reviewed and are negative.   Per HPI unless specifically indicated above   Allergies as of 12/02/2021   No Known Allergies      Medication List        Accurate as of December 02, 2021  1:40 PM. If you have any questions, ask your nurse or doctor.          allopurinol 100 MG tablet Commonly known as: ZYLOPRIM Take 1 tablet (100 mg total) by mouth daily.   amLODipine 10 MG tablet Commonly known as: NORVASC Take 1 tablet (10 mg total) by mouth daily.   atorvastatin 40 MG tablet Commonly known as: LIPITOR Take 1 tablet (40 mg total) by mouth at bedtime.   cetirizine 10 MG tablet Commonly known as: ZYRTEC Take 10 mg by mouth daily.   cyclobenzaprine 10 MG tablet Commonly known as: FLEXERIL Take 1 tablet (10 mg total) by mouth 3 (three) times daily.         Objective:   BP 127/72   Pulse 84   Temp 98.4 F (36.9 C)   Ht '5\' 8"'  (1.727 m)   Wt (!) 315 lb (142.9 kg)   SpO2 95%   BMI 47.90 kg/m   Wt Readings from Last 3 Encounters:  12/02/21 (!) 315 lb (142.9 kg)  09/22/21 (!) 302 lb 12.8 oz (137.3 kg)  08/31/21 (!) 305 lb (138.3 kg)    Physical Exam Vitals and nursing note reviewed.  Constitutional:      General: He is not in acute distress.    Appearance: He is well-developed. He is not diaphoretic.  Eyes:     General: No scleral icterus.    Conjunctiva/sclera: Conjunctivae normal.  Neck:     Thyroid: No thyromegaly.  Cardiovascular:     Rate and Rhythm: Normal rate and regular rhythm.     Heart sounds: Normal heart sounds. No murmur heard. Pulmonary:     Effort: Pulmonary effort is normal. No respiratory distress.     Breath sounds: Normal breath sounds.  No wheezing.  Musculoskeletal:        General: Normal range of motion.     Cervical back: Neck supple.  Lymphadenopathy:     Cervical: No cervical adenopathy.  Skin:    General: Skin is warm and dry.     Findings: No rash.  Neurological:     Mental Status: He is alert and oriented to person, place, and time.     Coordination: Coordination normal.  Psychiatric:        Behavior: Behavior normal.       Assessment & Plan:   Problem List Items Addressed This Visit       Cardiovascular and Mediastinum   Hypertension   Relevant Orders   CBC with Differential/Platelet   CMP14+EGFR   Lipid panel   Bayer DCA Hb A1c Waived     Other   Prediabetes - Primary   Relevant Orders   CBC with Differential/Platelet   CMP14+EGFR   Lipid panel   Bayer DCA Hb A1c Waived   Morbid obesity ([place])   Gout   Pure hypercholesterolemia    Patient blood pressure looks good today, no change.  We will check the rest of his blood work. Patient's A1c today is 6.3, continue with diet. Follow up plan: Return in about 3 months (around 03/04/2022), or if symptoms worsen or fail to improve, for Prediabetes and hypertension.  Counseling provided for all of the vaccine components Orders Placed This Encounter  Procedures   CBC with Differential/Platelet   CMP14+EGFR   Lipid panel   Bayer DCA Hb A1c Waived    Caryl Pina, MD [facility] 12/02/2021, 1:40 PM

## 2021-12-03 LAB — CMP14+EGFR
ALT: 23 IU/L (ref 0–44)
AST: 18 IU/L (ref 0–40)
Albumin/Globulin Ratio: 1.8 (ref 1.2–2.2)
Albumin: 3.9 g/dL — ABNORMAL LOW (ref 4.1–5.1)
Alkaline Phosphatase: 106 IU/L (ref 44–121)
BUN/Creatinine Ratio: 13 (ref 9–20)
BUN: 13 mg/dL (ref 6–24)
Bilirubin Total: <0.2 mg/dL (ref 0.0–1.2)
CO2: 20 mmol/L (ref 20–29)
Calcium: 8.5 mg/dL — ABNORMAL LOW (ref 8.7–10.2)
Chloride: 103 mmol/L (ref 96–106)
Creatinine, Ser: 1.04 mg/dL (ref 0.76–1.27)
Globulin, Total: 2.2 g/dL (ref 1.5–4.5)
Glucose: 128 mg/dL — ABNORMAL HIGH (ref 70–99)
Potassium: 4.5 mmol/L (ref 3.5–5.2)
Sodium: 139 mmol/L (ref 134–144)
Total Protein: 6.1 g/dL (ref 6.0–8.5)
eGFR: 93 mL/min/{1.73_m2} (ref >59)

## 2021-12-03 LAB — CBC WITH DIFFERENTIAL/PLATELET
Basophils Absolute: 0.1 10*3/uL (ref 0.0–0.2)
Basos: 1 %
EOS (ABSOLUTE): 0.4 10*3/uL (ref 0.0–0.4)
Eos: 5 %
Hematocrit: 46.1 % (ref 37.5–51.0)
Hemoglobin: 15.9 g/dL (ref 13.0–17.7)
Immature Grans (Abs): 0 10*3/uL (ref 0.0–0.1)
Immature Granulocytes: 0 %
Lymphocytes Absolute: 3.1 10*3/uL (ref 0.7–3.1)
Lymphs: 34 %
MCH: 29.7 pg (ref 26.6–33.0)
MCHC: 34.5 g/dL (ref 31.5–35.7)
MCV: 86 fL (ref 79–97)
Monocytes Absolute: 0.6 10*3/uL (ref 0.1–0.9)
Monocytes: 7 %
Neutrophils Absolute: 4.9 10*3/uL (ref 1.4–7.0)
Neutrophils: 53 %
Platelets: 285 10*3/uL (ref 150–450)
RBC: 5.36 x10E6/uL (ref 4.14–5.80)
RDW: 13.6 % (ref 11.6–15.4)
WBC: 9.1 10*3/uL (ref 3.4–10.8)

## 2021-12-03 LAB — LIPID PANEL
Chol/HDL Ratio: 4 ratio (ref 0.0–5.0)
Cholesterol, Total: 176 mg/dL (ref 100–199)
HDL: 44 mg/dL (ref >39)
LDL Chol Calc (NIH): 99 mg/dL (ref 0–99)
Triglycerides: 193 mg/dL — ABNORMAL HIGH (ref 0–149)
VLDL Cholesterol Cal: 33 mg/dL (ref 5–40)

## 2021-12-09 ENCOUNTER — Encounter: Payer: Medicare Other | Attending: Family Medicine | Admitting: Nutrition

## 2021-12-09 ENCOUNTER — Encounter: Payer: Self-pay | Admitting: Nutrition

## 2021-12-09 VITALS — Ht 68.0 in | Wt 308.0 lb

## 2021-12-09 DIAGNOSIS — E782 Mixed hyperlipidemia: Secondary | ICD-10-CM | POA: Insufficient documentation

## 2021-12-09 DIAGNOSIS — I10 Essential (primary) hypertension: Secondary | ICD-10-CM | POA: Insufficient documentation

## 2021-12-09 DIAGNOSIS — R7303 Prediabetes: Secondary | ICD-10-CM | POA: Insufficient documentation

## 2021-12-09 NOTE — Progress Notes (Signed)
Medical Nutrition Therapy:  Appt start time: 1054 end time: 1115  Assessment:  Primary concerns today:  Just back from beach and he admits he didn't make the best choices while there. Pre DM, obesity and HTN.   Changes made before he went to beach.  Has been jogging 30 minutes and walk 30 minutes- three days a week. Bone spur is better   Lost 7 lbs of fluid he thinks since last week. Wt went from 315 lbs to 308 today. His short term goal is 285 lbs. A1C 6.3% from 6.1%. TG elevated slightly, but improved from previous reading. Other lipid levels have improved also.  Wt Readings from Last 3 Encounters:  12/02/21 (!) 315 lb (142.9 kg)  09/22/21 (!) 302 lb 12.8 oz (137.3 kg)  08/31/21 (!) 305 lb (138.3 kg)   Ht Readings from Last 3 Encounters:  12/02/21 5\' 8"  (1.727 m)  09/22/21 5\' 8"  (1.727 m)  08/31/21 5\' 8"  (1.727 m)   There is no height or weight on file to calculate BMI. @BMIFA @ Facility age limit for growth %iles is 20 years. Facility age limit for growth %iles is 20 years.    Latest Ref Rng & Units 12/02/2021    1:29 PM 05/25/2021    1:28 PM 11/20/2020    2:28 PM  CMP  Glucose 70 - 99 mg/dL  02/01/2022  95   BUN 6 - 24 mg/dL 13  16  10    Creatinine 0.76 - 1.27 mg/dL 05/27/2021  11/22/2020  638   Sodium 134 - 144 mmol/L 139  143  142   Potassium 3.5 - 5.2 mmol/L 4.5  5.2  4.7   Chloride 96 - 106 mmol/L 103  107  109   CO2 20 - 29 mmol/L 20  22  20    Calcium 8.7 - 10.2 mg/dL 8.5  9.3  8.8   Total Protein 6.0 - 8.5 g/dL 6.1  6.8  6.5   Total Bilirubin 0.0 - 1.2 mg/dL 937   0.2   Alkaline Phos 44 - 121 IU/L 106  110  108   AST 0 - 40 IU/L 18  21  28    ALT 0 - 44 IU/L 23  29  37    Lab Results  Component Value Date   HGBA1C 6.3 (H) 12/02/2021   Lipid Panel     Component Value Date/Time   CHOL 176 12/02/2021 1329   TRIG 193 (H) 12/02/2021 1329   HDL 44 12/02/2021 1329   CHOLHDL 4.0 12/02/2021 1329   LDLCALC 99 12/02/2021 1329   LABVLDL 33 12/02/2021 1329     Preferred  Learning Style:   No preference indicated   Learning Readiness:  Ready Change in progress   MEDICATIONS: see chart   DIETARY INTAKE:  24-hr recall:  B ( AM): Cherrios with 2% milk, with grapes-9, water L ( PM): 02/01/2022 sandwich on wheat bread, Water D ( PM): Baked chicken, corn and green beans, water Snk ( PM):  Beverages:  Water- 5-6 bottles per day'  Usual physical activity: walks alot  Estimated energy needs: 1800  calories 200 g carbohydrates 135 g protein 50 g fat  Progress Towards Goal(s):  In progress.   Nutritional Diagnosis:  NB-1.1 Food and nutrition-related knowledge deficit As related to Obesity.  As evidenced by Obesity.    Intervention:  Nutrition and  Pre Diabetes education provided on Plant based food choices and lifestyle medicine to improve his health and reduce co morbidities. Low  purine diet for his gout.   High Fiber diet and plant based foods.  Goals Keep walking 2-3 miles 3-4 times per month. Focus on more fresh fruits and vegetables, plant based foods. Get A1C 5.7% or below. Lose 1-2 lbs per week.   Lifestyle Medicine - Whole Food, Plant Predominant Nutrition is highly recommended: Eat Plenty of vegetables, Mushrooms, fruits, Legumes, Whole Grains, Nuts, seeds in lieu of processed meats, processed snacks/pastries red meat, poultry, eggs.    -It is better to avoid simple carbohydrates including: Cakes, Sweet Desserts, Ice Cream, Soda (diet and regular), Sweet Tea, Candies, Chips, Cookies, Store Bought Juices, Alcohol in Excess of  1-2 drinks a day, Lemonade,  Artificial Sweeteners, Doughnuts, Coffee Creamers, "Sugar-free" Products, etc, etc.  This is not a complete list.....  Exercise: If you are able: 30 -60 minutes a day ,4 days a week, or 150 minutes a week.  The longer the better.  Combine stretch, strength, and aerobic activities.  If you were told in the past that you have high risk for cardiovascular diseases, you may seek evaluation by  your heart doctor prior to initiating moderate to intense exercise programs.   Teaching Method Utilized:  Visual Auditory Hands on  Handouts given during visit include: The Plate Method Meal Plan   Barriers to learning/adherence to lifestyle change: none  Demonstrated degree of understanding via:  Teach Back   Monitoring/Evaluation:  Dietary intake, exercise,  and body weight in 3 month(s).  Recommend to consider prescribing a GLP-1 for needed weight loss as he tries to implement lifestyle changes.

## 2021-12-09 NOTE — Patient Instructions (Signed)
Goals Keep walking 2-3 miles 3-4 times per month. Focus on more fresh fruits and vegetables, plant based foods. Get A1C 5.7% or below. Lose 1-2 lbs per week.

## 2021-12-18 ENCOUNTER — Ambulatory Visit (INDEPENDENT_AMBULATORY_CARE_PROVIDER_SITE_OTHER): Payer: Medicare Other

## 2021-12-18 VITALS — Wt 308.0 lb

## 2021-12-18 DIAGNOSIS — Z0000 Encounter for general adult medical examination without abnormal findings: Secondary | ICD-10-CM

## 2021-12-18 NOTE — Patient Instructions (Signed)
[name] , Thank you for taking time to come for your Medicare Wellness Visit. I appreciate your ongoing commitment to your health goals. Please review the following plan we discussed and let me know if I can assist you in the future.   Screening recommendations/referrals: Colonoscopy: Due at age 40 Recommended yearly ophthalmology/optometry visit for glaucoma screening and checkup Recommended yearly dental visit for hygiene and checkup  Vaccinations: Influenza vaccine: Due in Fall Pneumococcal vaccine: Due at age 63 Tdap vaccine: Done 01/01/2016 -  Repeat in 10 years Shingles vaccine: Due at age 8   Covid-19: Done  08/10/2019, 09/13/2019, & 04/29/2020  Advanced directives: Please bring a copy of your health care power of attorney and living will to the office to be added to your chart at your convenience.   Conditions/risks identified: Aim for 30 minutes of exercise or brisk walking, 6-8 glasses of water, and 5 servings of fruits and vegetables each day.   Next appointment: Follow up in one year for your annual wellness visit   Preventive Care 40-64 Years, Male Preventive care refers to lifestyle choices and visits with your health care provider that can promote health and wellness. What does preventive care include? A yearly physical exam. This is also called an annual well check. Dental exams once or twice a year. Routine eye exams. Ask your health care provider how often you should have your eyes checked. Personal lifestyle choices, including: Daily care of your teeth and gums. Regular physical activity. Eating a healthy diet. Avoiding tobacco and drug use. Limiting alcohol use. Practicing safe sex. Taking low-dose aspirin every day starting at age 34. What happens during an annual well check? The services and screenings done by your health care provider during your annual well check will depend on your age, overall health, lifestyle risk factors, and family history of  disease. Counseling  Your health care provider may ask you questions about your: Alcohol use. Tobacco use. Drug use. Emotional well-being. Home and relationship well-being. Sexual activity. Eating habits. Work and work Astronomer. Screening  You may have the following tests or measurements: Height, weight, and BMI. Blood pressure. Lipid and cholesterol levels. These may be checked every 5 years, or more frequently if you are over 59 years old. Skin check. Lung cancer screening. You may have this screening every year starting at age 4 if you have a 30-pack-year history of smoking and currently smoke or have quit within the past 15 years. Fecal occult blood test (FOBT) of the stool. You may have this test every year starting at age 58. Flexible sigmoidoscopy or colonoscopy. You may have a sigmoidoscopy every 5 years or a colonoscopy every 10 years starting at age 18. Prostate cancer screening. Recommendations will vary depending on your family history and other risks. Hepatitis C blood test. Hepatitis B blood test. Sexually transmitted disease (STD) testing. Diabetes screening. This is done by checking your blood sugar (glucose) after you have not eaten for a while (fasting). You may have this done every 1-3 years. Discuss your test results, treatment options, and if necessary, the need for more tests with your health care provider. Vaccines  Your health care provider may recommend certain vaccines, such as: Influenza vaccine. This is recommended every year. Tetanus, diphtheria, and acellular pertussis (Tdap, Td) vaccine. You may need a Td booster every 10 years. Zoster vaccine. You may need this after age 53. Pneumococcal 13-valent conjugate (PCV13) vaccine. You may need this if you have certain conditions and have not been  vaccinated. Pneumococcal polysaccharide (PPSV23) vaccine. You may need one or two doses if you smoke cigarettes or if you have certain conditions. Talk to your  health care provider about which screenings and vaccines you need and how often you need them. This information is not intended to replace advice given to you by your health care provider. Make sure you discuss any questions you have with your health care provider. Document Released: 05/16/2015 Document Revised: 01/07/2016 Document Reviewed: 02/18/2015 Elsevier Interactive Patient Education  2017 [employer].  Fall Prevention in the Home Falls can cause injuries. They can happen to people of all ages. There are many things you can do to make your home safe and to help prevent falls. What can I do on the outside of my home? Regularly fix the edges of walkways and driveways and fix any cracks. Remove anything that might make you trip as you walk through a door, such as a raised step or threshold. Trim any bushes or trees on the path to your home. Use bright outdoor lighting. Clear any walking paths of anything that might make someone trip, such as rocks or tools. Regularly check to see if handrails are loose or broken. Make sure that both sides of any steps have handrails. Any raised decks and porches should have guardrails on the edges. Have any leaves, snow, or ice cleared regularly. Use sand or salt on walking paths during winter. Clean up any spills in your garage right away. This includes oil or grease spills. What can I do in the bathroom? Use night lights. Install grab bars by the toilet and in the tub and shower. Do not use towel bars as grab bars. Use non-skid mats or decals in the tub or shower. If you need to sit down in the shower, use a plastic, non-slip stool. Keep the floor dry. Clean up any water that spills on the floor as soon as it happens. Remove soap buildup in the tub or shower regularly. Attach bath mats securely with double-sided non-slip rug tape. Do not have throw rugs and other things on the floor that can make you trip. What can I do in the bedroom? Use night  lights. Make sure that you have a light by your bed that is easy to reach. Do not use any sheets or blankets that are too big for your bed. They should not hang down onto the floor. Have a firm chair that has side arms. You can use this for support while you get dressed. Do not have throw rugs and other things on the floor that can make you trip. What can I do in the kitchen? Clean up any spills right away. Avoid walking on wet floors. Keep items that you use a lot in easy-to-reach places. If you need to reach something above you, use a strong step stool that has a grab bar. Keep electrical cords out of the way. Do not use floor polish or wax that makes floors slippery. If you must use wax, use non-skid floor wax. Do not have throw rugs and other things on the floor that can make you trip. What can I do with my stairs? Do not leave any items on the stairs. Make sure that there are handrails on both sides of the stairs and use them. Fix handrails that are broken or loose. Make sure that handrails are as long as the stairways. Check any carpeting to make sure that it is firmly attached to the stairs. Fix any carpet  that is loose or worn. Avoid having throw rugs at the top or bottom of the stairs. If you do have throw rugs, attach them to the floor with carpet tape. Make sure that you have a light switch at the top of the stairs and the bottom of the stairs. If you do not have them, ask someone to add them for you. What else can I do to help prevent falls? Wear shoes that: Do not have high heels. Have rubber bottoms. Are comfortable and fit you well. Are closed at the toe. Do not wear sandals. If you use a stepladder: Make sure that it is fully opened. Do not climb a closed stepladder. Make sure that both sides of the stepladder are locked into place. Ask someone to hold it for you, if possible. Clearly mark and make sure that you can see: Any grab bars or handrails. First and last  steps. Where the edge of each step is. Use tools that help you move around (mobility aids) if they are needed. These include: Canes. Walkers. Scooters. Crutches. Turn on the lights when you go into a dark area. Replace any light bulbs as soon as they burn out. Set up your furniture so you have a clear path. Avoid moving your furniture around. If any of your floors are uneven, fix them. If there are any pets around you, be aware of where they are. Review your medicines with your doctor. Some medicines can make you feel dizzy. This can increase your chance of falling. Ask your doctor what other things that you can do to help prevent falls. This information is not intended to replace advice given to you by your health care provider. Make sure you discuss any questions you have with your health care provider. Document Released: 02/13/2009 Document Revised: 09/25/2015 Document Reviewed: 05/24/2014 Elsevier Interactive Patient Education  2017 [employer].

## 2021-12-18 NOTE — Progress Notes (Signed)
Subjective:   Leonard Rivera is a 41 y.o. male who presents for Medicare Annual/Subsequent preventive examination.  Virtual Visit via Telephone Note  I connected with  Leonard Rivera on 12/18/21 at  1:15 PM EDT by telephone and verified that I am speaking with the correct person using two identifiers.  Location: Patient: Home Provider: [facility] Persons participating in the virtual visit: patient/Nurse Health Advisor   I discussed the limitations, risks, security and privacy concerns of performing an evaluation and management service by telephone and the availability of in person appointments. The patient expressed understanding and agreed to proceed.  Interactive audio and video telecommunications were attempted between this nurse and patient, however failed, due to patient having technical difficulties OR patient did not have access to video capability.  We continued and completed visit with audio only.  Some vital signs may be absent or patient reported.   Leonard Nick E Nissim Fleischer, LPN   Review of Systems     Cardiac Risk Factors include: advanced age (>21mn, >>12women);dyslipidemia;hypertension;male gender;smoking/ tobacco exposure;obesity (BMI >30kg/m2)     Objective:    Today's Vitals   12/18/21 1318  Weight: (!) 308 lb (139.7 kg)   Body mass index is 46.83 kg/m.     12/18/2021    1:22 PM 12/17/2020    1:20 PM 02/05/2020    8:37 AM 12/17/2019   11:15 AM 12/06/2018    3:33 PM 10/25/2018    5:07 PM 12/04/2017   10:20 PM  Advanced Directives  Does Patient Have a Medical Advance Directive? No Yes No Yes No No No  Type of A[place]porate [place]  [place]    Does patient want to make changes to medical advance directive?    No - Patient declined     [place] of [place]?  No - copy requested       Would patient like information on creating a medical advance directive? No - Patient declined   No - Patient  declined Yes (MAU/Ambulatory/Procedural Areas - Information given) No - Patient declined No - Patient declined    Current Medications (verified) Outpatient Encounter Medications as of 12/18/2021  Medication Sig   allopurinol (ZYLOPRIM) 100 MG tablet Take 1 tablet (100 mg total) by mouth daily.   amLODipine (NORVASC) 10 MG tablet Take 1 tablet (10 mg total) by mouth daily.   atorvastatin (LIPITOR) 40 MG tablet Take 1 tablet (40 mg total) by mouth at bedtime.   cetirizine (ZYRTEC) 10 MG tablet Take 10 mg by mouth daily.   cyclobenzaprine (FLEXERIL) 10 MG tablet Take 1 tablet (10 mg total) by mouth 3 (three) times daily.   No facility-administered encounter medications on file as of 12/18/2021.    Allergies (verified) Patient has no known allergies.   History: Past Medical History:  Diagnosis Date   Gout    Hypertension    Morbid obesity ([place]    History reviewed. No pertinent surgical history. Family History  Problem Relation Age of Onset   Asthma Brother    Social History   Socioeconomic History   Marital status: Single    Spouse name: Not on file   Number of children: Not on file   Years of education: Not on file   Highest education level: 12th grade  Occupational History   Occupation: Disabled  Tobacco Use   Smoking status: Former    Types: Cigars    Quit date: 10/31/2021  Years since quitting: 0.1   Smokeless tobacco: Never   Tobacco comments:    Started smoking cigars intermittently at age 61 - quit for good 10/2021 (no cigarettes)   Vaping Use   Vaping Use: Never used  Substance and Sexual Activity   Alcohol use: No   Drug use: No   Sexual activity: Yes  Other Topics Concern   Not on file  Social History Narrative   Lives with grandmother - no children   Social Determinants of Health   Financial Resource Strain: Low Risk  (12/18/2021)   Overall Financial Resource Strain (CARDIA)    Difficulty of Paying Living Expenses: Not hard at all  Food Insecurity:  No Food Insecurity (12/18/2021)   Hunger Vital Sign    Worried About Running Out of Food in the Last Year: Never true    Ran Out of Food in the Last Year: Never true  Transportation Needs: No Transportation Needs (12/18/2021)   PRAPARE - Hydrologist (Medical): No    Lack of Transportation (Non-Medical): No  Physical Activity: Sufficiently Active (12/18/2021)   Exercise Vital Sign    Days of Exercise per Week: 7 days    Minutes of Exercise per Session: 60 min  Stress: No Stress Concern Present (12/18/2021)   [place]    Feeling of Stress : Not at all  Social Connections: Moderately Isolated (12/18/2021)   Social Connection and Isolation Panel [NHANES]    Frequency of Communication with Friends and Family: More than three times a week    Frequency of Social Gatherings with Friends and Family: More than three times a week    Attends Religious Services: Never    Marine scientist or Organizations: Yes    Attends Music therapist: More than 4 times per year    Marital Status: Never married    Tobacco Counseling Counseling given: No Tobacco comments: Started smoking cigars intermittently at age 76 - quit for good 10/2021 (no cigarettes)    Clinical Intake:  Pre-visit preparation completed: Yes  Pain : No/denies pain     BMI - recorded: 46.83 Nutritional Status: BMI > 30  Obese Nutritional Risks: None Diabetes: No  How often do you need to have someone help you when you read instructions, pamphlets, or other written materials from your doctor or pharmacy?: 1 - Never  Diabetic? no  Interpreter Needed?: No  Information entered by :: Raetta Agostinelli, LPN   Activities of Daily Living    12/18/2021    1:23 PM  In your present state of health, do you have any difficulty performing the following activities:  Hearing? 0  Vision? 0  Difficulty concentrating or making  decisions? 0  Walking or climbing stairs? 0  Dressing or bathing? 0  Doing errands, shopping? 0  Preparing Food and eating ? N  Using the Toilet? N  In the past six months, have you accidently leaked urine? N  Do you have problems with loss of bowel control? N  Managing your Medications? N  Managing your Finances? N  Housekeeping or managing your Housekeeping? N    Patient Care Team: Dettinger, Fransisca Kaufmann, MD as PCP - General (Family Medicine) Okey Regal, Melvin (Optometry) Arlana Lindau, RD as Dietitian (Nutrition)  Indicate any recent Medical Services you may have received from other than [facility] providers in the past year (date may be approximate).     Assessment:  This is a routine wellness examination for Leonard Rivera.  Hearing/Vision screen Hearing Screening - Comments:: Denies hearing difficulties   Vision Screening - Comments:: Wears rx glasses - behind with routine eye exams with [facility]  Dietary issues and exercise activities discussed: Current Exercise Habits: Home exercise routine, Type of exercise: walking, Time (Minutes): 60, Frequency (Times/Week): 7, Weekly Exercise (Minutes/Week): 420, Intensity: Mild, Exercise limited by: None identified   Goals Addressed             This Visit's Progress    COMPLETED: Exercise 3x per week (30 min per time)       Try to exercise for at least 30 minutes, 3 times weekly      Weight (lb) < 280 lb (127 kg)   308 lb (139.7 kg)      Depression Screen    12/18/2021    1:21 PM 12/02/2021    1:20 PM 08/31/2021    2:25 PM 02/20/2021   10:51 AM 12/17/2020    1:19 PM 11/20/2020    1:26 PM 08/28/2020    1:57 PM  PHQ 2/9 Scores  PHQ - 2 Score 0 0 0 0 0 0 0  PHQ- 9 Score 0          Fall Risk    12/18/2021    1:20 PM 12/02/2021    1:20 PM 08/31/2021    2:25 PM 02/20/2021   10:51 AM 12/17/2020    1:20 PM  Fall Risk   Falls in the past year? 0 0 0 0 0  Number falls in past yr: 0    0  Injury with Fall? 0    0  Risk for fall  due to : No Fall Risks    Orthopedic patient;Impaired vision;Medication side effect  Follow up Falls prevention discussed    Falls prevention discussed    FALL RISK PREVENTION PERTAINING TO THE HOME:  Any stairs in or around the home? Yes  If so, are there any without handrails? Yes  - just 2 steps to come into home Home free of loose throw rugs in walkways, pet beds, electrical cords, etc? No  Adequate lighting in your home to reduce risk of falls? No   ASSISTIVE DEVICES UTILIZED TO PREVENT FALLS:  Life alert? No  Use of a cane, walker or w/c? No  Grab bars in the bathroom? Yes  Shower chair or bench in shower? Yes  Elevated toilet seat or a handicapped toilet? Yes   TIMED UP AND GO:  Was the test performed? No . Telephonic visit  Cognitive Function:        12/18/2021    1:24 PM 12/17/2019   11:17 AM 12/06/2018    3:34 PM  6CIT Screen  What Year? 0 points 0 points 0 points  What month? 0 points 0 points 0 points  What time? 0 points 0 points 0 points  Count back from 20 0 points 0 points 0 points  Months in reverse 4 points 2 points 0 points  Repeat phrase 0 points 0 points 0 points  Total Score 4 points 2 points 0 points    Immunizations Immunization History  Administered Date(s) Administered   DTaP 07/14/1981, 09/15/1981, 11/10/1981   Hepatitis B 01/20/1995, 02/24/1995, 08/11/1995   IPV 07/14/1981, 09/15/1981, 11/10/1981   Influenza,inj,Quad PF,6+ Mos 02/01/2016, 02/28/2020   MMR 08/31/1982   Moderna Sars-Covid-2 Vaccination 08/10/2019, 09/13/2019   Tdap 01/01/2016    TDAP status: Up to date  Flu Vaccine status: Declined,  Education has been provided regarding the importance of this vaccine but patient still declined. Advised may receive this vaccine at local pharmacy or Health Dept. Aware to provide a copy of the vaccination record if obtained from local pharmacy or Health Dept. Verbalized acceptance and understanding.  Pneumococcal vaccine status: Declined,   Education has been provided regarding the importance of this vaccine but patient still declined. Advised may receive this vaccine at local pharmacy or Health Dept. Aware to provide a copy of the vaccination record if obtained from local pharmacy or Health Dept. Verbalized acceptance and understanding.   Covid-19 vaccine status: Completed vaccines  Qualifies for Shingles Vaccine? No   Zostavax completed No   Shingrix Completed?: No.    Education has been provided regarding the importance of this vaccine. Patient has been advised to call insurance company to determine out of pocket expense if they have not yet received this vaccine. Advised may also receive vaccine at local pharmacy or Health Dept. Verbalized acceptance and understanding.  Screening Tests Health Maintenance  Topic Date Due   COVID-19 Vaccine (3 - Moderna series) 11/08/2019   INFLUENZA VACCINE  12/01/2021   TETANUS/TDAP  12/31/2025   Hepatitis C Screening  Completed   HIV Screening  Completed   HPV VACCINES  Aged Out    Health Maintenance  Health Maintenance Due  Topic Date Due   COVID-19 Vaccine (3 - Moderna series) 11/08/2019   INFLUENZA VACCINE  12/01/2021    Colorectal cancer screening: No longer required.   Lung Cancer Screening: (Low Dose CT Chest recommended if Age 38-80 years, 30 pack-year currently smoking OR have quit w/in 15years.) does not qualify  Additional Screening:  Hepatitis C Screening: does not qualify; Completed 02/28/2020  Vision Screening: Recommended annual ophthalmology exams for early detection of glaucoma and other disorders of the eye. Is the patient up to date with their annual eye exam?  No  Who is the provider or what is the name of the office in which the patient attends annual eye exams? [place] If pt is not established with a provider, would they like to be referred to a provider to establish care? No .   Dental Screening: Recommended annual dental exams for proper oral  hygiene  Community Resource Referral / Chronic Care Management: CRR required this visit?  No   CCM required this visit?  No      Plan:     I have personally reviewed and noted the following in the patient's chart:   Medical and social history Use of alcohol, tobacco or illicit drugs  Current medications and supplements including opioid prescriptions. Patient is not currently taking opioid prescriptions. Functional ability and status Nutritional status Physical activity Advanced directives List of other physicians Hospitalizations, surgeries, and ER visits in previous 12 months Vitals Screenings to include cognitive, depression, and falls Referrals and appointments  In addition, I have reviewed and discussed with patient certain preventive protocols, quality metrics, and best practice recommendations. A written personalized care plan for preventive services as well as general preventive health recommendations were provided to patient.     Sandrea Hammond, LPN   9/98/0012   Nurse Notes: None

## 2022-01-26 ENCOUNTER — Encounter: Payer: Self-pay | Admitting: *Deleted

## 2022-02-26 ENCOUNTER — Encounter: Payer: Self-pay | Admitting: Family Medicine

## 2022-02-26 ENCOUNTER — Ambulatory Visit (INDEPENDENT_AMBULATORY_CARE_PROVIDER_SITE_OTHER): Payer: Medicare Other | Admitting: Family Medicine

## 2022-02-26 VITALS — BP 132/84 | HR 75 | Temp 98.4°F | Ht 68.0 in | Wt 306.0 lb

## 2022-02-26 DIAGNOSIS — R7303 Prediabetes: Secondary | ICD-10-CM

## 2022-02-26 DIAGNOSIS — I10 Essential (primary) hypertension: Secondary | ICD-10-CM

## 2022-02-26 DIAGNOSIS — E7800 Pure hypercholesterolemia, unspecified: Secondary | ICD-10-CM

## 2022-02-26 DIAGNOSIS — Z23 Encounter for immunization: Secondary | ICD-10-CM | POA: Diagnosis not present

## 2022-02-26 NOTE — Progress Notes (Signed)
BP 132/84   Pulse 75   Temp 98.4 F (36.9 C)   Ht 5\' 8"  (1.727 m)   Wt (!) 306 lb (138.8 kg)   SpO2 97%   BMI 46.53 kg/m    Subjective:   Patient ID: Leonard Rivera, male    DOB: Mar 28, 1981, 41 y.o.   MRN: [id number]  HPI: Leonard Rivera is a 41 y.o. male presenting on 02/26/2022 for Medical Management of Chronic Issues, Hypertension (Pt states he feels pretty good , checking It at home states he cant remember the readings ), and Prediabetes   HPI Prediabetes Patient comes in today for recheck of his diabetes. Patient has been currently taking no medication, has been diet controlled. Patient is not currently on an ACE inhibitor/ARB. Patient has not seen an ophthalmologist this year. Patient denies any issues with their feet. The symptom started onset as an adult hypertension and hyperlipidemia ARE RELATED TO DM   Hypertension Patient is currently on amlodipine, and their blood pressure today is 132/84. Patient denies any lightheadedness or dizziness. Patient denies headaches, blurred vision, chest pains, shortness of breath, or weakness. Denies any side effects from medication and is content with current medication.   Hyperlipidemia Patient is coming in for recheck of his hyperlipidemia. The patient is currently taking atorvastatin. They deny any issues with myalgias or history of liver damage from it. They deny any focal numbness or weakness or chest pain.   Relevant past medical, surgical, family and social history reviewed and updated as indicated. Interim medical history since our last visit reviewed. Allergies and medications reviewed and updated.  Review of Systems  Constitutional:  Negative for chills and fever.  Eyes:  Negative for visual disturbance.  Respiratory:  Negative for shortness of breath and wheezing.   Cardiovascular:  Negative for chest pain and leg swelling.  Musculoskeletal:  Negative for back pain and gait problem.  Skin:  Negative for rash.  All other  systems reviewed and are negative.   Per HPI unless specifically indicated above   Allergies as of 02/26/2022   No Known Allergies      Medication List        Accurate as of February 26, 2022 10:49 AM. If you have any questions, ask your nurse or doctor.          allopurinol 100 MG tablet Commonly known as: ZYLOPRIM Take 1 tablet (100 mg total) by mouth daily.   amLODipine 10 MG tablet Commonly known as: NORVASC Take 1 tablet (10 mg total) by mouth daily.   atorvastatin 40 MG tablet Commonly known as: LIPITOR Take 1 tablet (40 mg total) by mouth at bedtime.   cetirizine 10 MG tablet Commonly known as: ZYRTEC Take 10 mg by mouth daily.   cyclobenzaprine 10 MG tablet Commonly known as: FLEXERIL Take 1 tablet (10 mg total) by mouth 3 (three) times daily.         Objective:   BP 132/84   Pulse 75   Temp 98.4 F (36.9 C)   Ht 5\' 8"  (1.727 m)   Wt (!) 306 lb (138.8 kg)   SpO2 97%   BMI 46.53 kg/m   Wt Readings from Last 3 Encounters:  02/26/22 (!) 306 lb (138.8 kg)  12/18/21 (!) 308 lb (139.7 kg)  12/09/21 (!) 308 lb (139.7 kg)    Physical Exam Vitals and nursing note reviewed.  Constitutional:      General: He is not in acute distress.    Appearance: He  is well-developed. He is not diaphoretic.  Eyes:     General: No scleral icterus.    Conjunctiva/sclera: Conjunctivae normal.  Neck:     Thyroid: No thyromegaly.  Cardiovascular:     Rate and Rhythm: Normal rate and regular rhythm.     Heart sounds: Normal heart sounds. No murmur heard. Pulmonary:     Effort: Pulmonary effort is normal. No respiratory distress.     Breath sounds: Normal breath sounds. No wheezing.  Musculoskeletal:        General: Normal range of motion.     Cervical back: Neck supple.  Lymphadenopathy:     Cervical: No cervical adenopathy.  Skin:    General: Skin is warm and dry.     Findings: No rash.  Neurological:     Mental Status: He is alert and oriented to  person, place, and time.     Coordination: Coordination normal.  Psychiatric:        Behavior: Behavior normal.       Assessment & Plan:   Problem List Items Addressed This Visit       Cardiovascular and Mediastinum   Hypertension     Other   Prediabetes - Primary   Pure hypercholesterolemia    Too soon for blood work, will do it next time.  Seems to be doing well, blood pressure looks good, no changes today but will see back in 3 months. Follow up plan: Return in about 3 months (around 05/29/2022), or if symptoms worsen or fail to improve, for Prediabetes hypertension and cholesterol.  Counseling provided for all of the vaccine components No orders of the defined types were placed in this encounter.   Caryl Pina, MD [facility] 02/26/2022, 10:49 AM

## 2022-03-04 ENCOUNTER — Ambulatory Visit: Payer: Medicare Other | Admitting: Family Medicine

## 2022-03-10 ENCOUNTER — Encounter: Payer: Medicare Other | Attending: Family Medicine | Admitting: Nutrition

## 2022-03-10 VITALS — Ht 68.0 in | Wt 307.0 lb

## 2022-03-10 DIAGNOSIS — E782 Mixed hyperlipidemia: Secondary | ICD-10-CM | POA: Insufficient documentation

## 2022-03-10 DIAGNOSIS — R7303 Prediabetes: Secondary | ICD-10-CM | POA: Insufficient documentation

## 2022-03-10 DIAGNOSIS — I10 Essential (primary) hypertension: Secondary | ICD-10-CM | POA: Insufficient documentation

## 2022-03-10 NOTE — Patient Instructions (Addendum)
Goals Increase walk/running weekly. Cut down on chicken and add more dried beans and vegetables to meals. Get A1C downt o 5.7%. Avoid skipping meal. Try to eat more fruit with meals Lose 2 lbs per month.

## 2022-03-10 NOTE — Progress Notes (Signed)
Start 1108   End 1135  Assessment:  Primary concerns today: Follow up  Pre DM, obesity and HTN.   Saw Dr. Louanne Skye appt last week. Will get A1C done  next visit. Lost 1 lb since last visit. Has lost a total of 7 lbs since August 2023. Has been cutting down on the bread.  Sleeping better. Getting in 8-9 hrs sleep.  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  Working on eating more fruits, vegetables and eating baked foods.  Diet still needs more high fiber rich foods and watching portions for more consistent weight loss.  Wt Readings from Last 3 Encounters:  02/26/22 (!) 306 lb (138.8 kg)  12/18/21 (!) 308 lb (139.7 kg)  12/09/21 (!) 308 lb (139.7 kg)   Ht Readings from Last 3 Encounters:  02/26/22 5\' 8"  (1.727 m)  12/09/21 5\' 8"  (1.727 m)  12/02/21 5\' 8"  (1.727 m)   There is no height or weight on file to calculate BMI. @BMIFA @ Facility age limit for growth %iles is 20 years. Facility age limit for growth %iles is 20 years.    Latest Ref Rng & Units 12/02/2021    1:29 PM 05/25/2021    1:28 PM 11/20/2020    2:28 PM  CMP  Glucose 70 - 99 mg/dL  02/01/2022  95   BUN 6 - 24 mg/dL 13  16  10    Creatinine 0.76 - 1.27 mg/dL 05/27/2021  11/22/2020  834   Sodium 134 - 144 mmol/L 139  143  142   Potassium 3.5 - 5.2 mmol/L 4.5  5.2  4.7   Chloride 96 - 106 mmol/L 103  107  109   CO2 20 - 29 mmol/L 20  22  20    Calcium 8.7 - 10.2 mg/dL 8.5  9.3  8.8   Total Protein 6.0 - 8.5 g/dL 6.1  6.8  6.5   Total Bilirubin 0.0 - 1.2 mg/dL 196   0.2   Alkaline Phos 44 - 121 IU/L 106  110  108   AST 0 - 40 IU/L 18  21  28    ALT 0 - 44 IU/L 23  29  37    Lab Results  Component Value Date   HGBA1C 6.3 (H) 12/02/2021   Lipid Panel     Component Value Date/Time   CHOL 176 12/02/2021 1329   TRIG 193 (H) 12/02/2021 1329   HDL 44 12/02/2021 1329   CHOLHDL 4.0 12/02/2021 1329   LDLCALC 99 12/02/2021 1329   LABVLDL 33 12/02/2021 1329     Preferred  Learning Style:   No preference indicated   Learning Readiness:  Ready Change in progress   MEDICATIONS: see chart   DIETARY INTAKE:  24-hr recall:  B ( AM): Cherrios with 2% milk, with apple, water Baked chicken wings, applesauce, broccoli, water D ( PM): Baked pork chops, green beans and corn, water ( PM):  Beverages:  Water- 5-6 bottles per day'  Usual physical activity: walks alot  Estimated energy needs: 1800  calories 200 g carbohydrates 135 g protein 50 g fat  Progress Towards Goal(s):  In progress.   Nutritional Diagnosis:  NB-1.1 Food and nutrition-related knowledge deficit As related to Obesity.  As evidenced by Obesity.    Intervention:  Nutrition and  Pre Diabetes education provided on Plant based food choices and lifestyle medicine to improve his health and reduce co morbidities. Low purine diet for his  gout.   High Fiber diet and plant based foods.   Goals Increase walk/running weekly. Cut down on chicken and add more dried beans and vegetables to meals. Get A1C down to 5.7%. Avoid skipping meal. Try to eat more fruit with meals Lose 2 lbs per month.  Lifestyle Medicine - Whole Food, Plant Predominant Nutrition is highly recommended: Eat Plenty of vegetables, Mushrooms, fruits, Legumes, Whole Grains, Nuts, seeds in lieu of processed meats, processed snacks/pastries red meat, poultry, eggs.    -It is better to avoid simple carbohydrates including: Cakes, Sweet Desserts, Ice Cream, Soda (diet and regular), Sweet Tea, Candies, Chips, Cookies, Store Bought Juices, Alcohol in Excess of  1-2 drinks a day, Lemonade,  Artificial Sweeteners, Doughnuts, Coffee Creamers, "Sugar-free" Products, etc, etc.  This is not a complete list.....  Exercise: If you are able: 30 -60 minutes a day ,4 days a week, or 150 minutes a week.  The longer the better.  Combine stretch, strength, and aerobic activities.  If you were told in the past that you have high risk for  cardiovascular diseases, you may seek evaluation by your heart doctor prior to initiating moderate to intense exercise programs.   Teaching Method Utilized:  Visual Auditory Hands on  Handouts given during visit include: The Plate Method Meal Plan   Barriers to learning/adherence to lifestyle change: none  Demonstrated degree of understanding via:  Teach Back   Monitoring/Evaluation:  Dietary intake, exercise,  and body weight in 3 month(s).  Recommend to consider prescribing a GLP-1 for needed weight loss as he tries to implement lifestyle changes.

## 2022-03-28 ENCOUNTER — Other Ambulatory Visit: Payer: Self-pay | Admitting: Family Medicine

## 2022-05-19 ENCOUNTER — Ambulatory Visit: Payer: Medicare Other | Admitting: Nutrition

## 2022-05-21 ENCOUNTER — Other Ambulatory Visit: Payer: Self-pay | Admitting: Family Medicine

## 2022-05-21 DIAGNOSIS — R7303 Prediabetes: Secondary | ICD-10-CM

## 2022-05-21 NOTE — Progress Notes (Signed)
Placed new referral for the patient for diabetes education

## 2022-05-26 ENCOUNTER — Encounter: Payer: Self-pay | Admitting: Nutrition

## 2022-05-26 ENCOUNTER — Encounter: Payer: 59 | Attending: Family Medicine | Admitting: Nutrition

## 2022-05-26 VITALS — Ht 68.0 in | Wt 311.0 lb

## 2022-05-26 DIAGNOSIS — E782 Mixed hyperlipidemia: Secondary | ICD-10-CM | POA: Insufficient documentation

## 2022-05-26 DIAGNOSIS — R7303 Prediabetes: Secondary | ICD-10-CM | POA: Insufficient documentation

## 2022-05-26 DIAGNOSIS — I10 Essential (primary) hypertension: Secondary | ICD-10-CM | POA: Insufficient documentation

## 2022-05-26 NOTE — Progress Notes (Signed)
Start 1100    End 1115  Assessment:  Primary concerns today: Follow up  Pre DM, obesity and HTN. Going to MD in February. Last A1C 6.3% 8/23, which was up from 6.1% previously.. Changes made: cut down bread. Hasn't been able to go to gym this past week due to being sick. Not testing blood sugars. Started back to gym this week.  Goes for an hour 1/2; bike, treadmill and elliptical.. Gained 4 lbs since visit. His grandmother cooks his meals. Eats out some. Admits to sometimes portions being an issue or eating out. Diet still needs more high fiber rich foods  of fruits, vegetables and whole grains and watching portions for more consistent weight loss.  Wt Readings from Last 3 Encounters:  05/26/22 (!) 311 lb (141.1 kg)  03/10/22 (!) 307 lb (139.3 kg)  02/26/22 (!) 306 lb (138.8 kg)   Ht Readings from Last 3 Encounters:  05/26/22 5\' 8"  (1.727 m)  03/10/22 5\' 8"  (1.727 m)  02/26/22 5\' 8"  (1.727 m)   Body mass index is 47.29 kg/m. @BMIFA @ Facility age limit for growth %iles is 20 years. Facility age limit for growth %iles is 20 years.   There is no height or weight on file to calculate BMI. @BMIFA @ Facility age limit for growth %iles is 20 years. Facility age limit for growth %iles is 20 years.    Latest Ref Rng & Units 12/02/2021    1:29 PM 05/25/2021    1:28 PM 11/20/2020    2:28 PM  CMP  Glucose 70 - 99 mg/dL 128  114  95   BUN 6 - 24 mg/dL 13  16  10    Creatinine 0.76 - 1.27 mg/dL 1.04  0.91  0.93   Sodium 134 - 144 mmol/L 139  143  142   Potassium 3.5 - 5.2 mmol/L 4.5  5.2  4.7   Chloride 96 - 106 mmol/L 103  107  109   CO2 20 - 29 mmol/L 20  22  20    Calcium 8.7 - 10.2 mg/dL 8.5  9.3  8.8   Total Protein 6.0 - 8.5 g/dL 6.1  6.8  6.5   Total Bilirubin 0.0 - 1.2 mg/dL <0.2  <0.2  0.2   Alkaline Phos 44 - 121 IU/L 106  110  108   AST 0 - 40 IU/L 18  21  28    ALT 0 - 44 IU/L 23  29  37    Lab Results  Component Value Date   HGBA1C 6.3 (H) 12/02/2021   Lipid Panel      Component Value Date/Time   CHOL 176 12/02/2021 1329   TRIG 193 (H) 12/02/2021 1329   HDL 44 12/02/2021 1329   CHOLHDL 4.0 12/02/2021 1329   LDLCALC 99 12/02/2021 1329   LABVLDL 33 12/02/2021 1329     Preferred Learning Style:   No preference indicated   Learning Readiness:  Ready Change in progress   MEDICATIONS: see chart   DIETARY INTAKE:  24-hr recall:  B ( AM):egg and 1 slice toast and [place] bacon, apple, water L)  PB sandwich, orange and water  D Baked chicken, green beans, corn, water  Beverages:  Water- 5-6 bottles per day'  Usual physical activity: walks alot  Estimated energy needs: 1800  calories 200 g carbohydrates 135 g protein 50 g fat  Progress Towards Goal(s):  In progress.   Nutritional Diagnosis:  NB-1.1 Food and nutrition-related knowledge deficit As related to Obesity.  As evidenced  by Obesity.    Intervention:  Nutrition and  Pre Diabetes education provided on Plant based food choices and lifestyle medicine to improve his health and reduce co morbidities. Low purine diet for his gout.   High Fiber diet and plant based foods.   Goals Increase walk/running weekly. Cut down on chicken and add more dried beans and vegetables to meals. Get A1C down to 5.7%. Avoid skipping meal. Try to eat more fruit with meals Lose 2 lbs per month.  Lifestyle Medicine - Whole Food, Plant Predominant Nutrition is highly recommended: Eat Plenty of vegetables, Mushrooms, fruits, Legumes, Whole Grains, Nuts, seeds in lieu of processed meats, processed snacks/pastries red meat, poultry, eggs.    -It is better to avoid simple carbohydrates including: Cakes, Sweet Desserts, Ice Cream, Soda (diet and regular), Sweet Tea, Candies, Chips, Cookies, Store Bought Juices, Alcohol in Excess of  1-2 drinks a day, Lemonade,  Artificial Sweeteners, Doughnuts, Coffee Creamers, "Sugar-free" Products, etc, etc.  This is not a complete list.....  Exercise: If you are able:  30 -60 minutes a day ,4 days a week, or 150 minutes a week.  The longer the better.  Combine stretch, strength, and aerobic activities.  If you were told in the past that you have high risk for cardiovascular diseases, you may seek evaluation by your heart doctor prior to initiating moderate to intense exercise programs.   Teaching Method Utilized:  Visual Auditory Hands on  Handouts given during visit include: The Plate Method Meal Plan   Barriers to learning/adherence to lifestyle change: none  Demonstrated degree of understanding via:  Teach Back   Monitoring/Evaluation:  Dietary intake, exercise,  and body weight in 3 month(s).  Recommend to consider prescribing a GLP-1 for needed weight loss as he tries to implement lifestyle changes.

## 2022-05-26 NOTE — Patient Instructions (Signed)
Goal  Lose 5 lbs per month. Increase more vegetables of broccoli, greens, cabbage, beets, squash, zucchini, spinach salad Keep drinking water Increase intensity of your workouts.

## 2022-05-31 DIAGNOSIS — M778 Other enthesopathies, not elsewhere classified: Secondary | ICD-10-CM | POA: Diagnosis not present

## 2022-06-02 ENCOUNTER — Encounter: Payer: Self-pay | Admitting: Family Medicine

## 2022-06-02 ENCOUNTER — Ambulatory Visit (INDEPENDENT_AMBULATORY_CARE_PROVIDER_SITE_OTHER): Payer: 59 | Admitting: Family Medicine

## 2022-06-02 VITALS — BP 146/84 | HR 69 | Ht 68.0 in | Wt 314.0 lb

## 2022-06-02 DIAGNOSIS — E7800 Pure hypercholesterolemia, unspecified: Secondary | ICD-10-CM | POA: Diagnosis not present

## 2022-06-02 DIAGNOSIS — R7303 Prediabetes: Secondary | ICD-10-CM | POA: Diagnosis not present

## 2022-06-02 DIAGNOSIS — I10 Essential (primary) hypertension: Secondary | ICD-10-CM

## 2022-06-02 LAB — CBC WITH DIFFERENTIAL/PLATELET
Basophils Absolute: 0.1 10*3/uL (ref 0.0–0.2)
Basos: 1 %
EOS (ABSOLUTE): 0.1 10*3/uL (ref 0.0–0.4)
Eos: 1 %
Hematocrit: 45.3 % (ref 37.5–51.0)
Hemoglobin: 15.1 g/dL (ref 13.0–17.7)
Immature Grans (Abs): 0 10*3/uL (ref 0.0–0.1)
Immature Granulocytes: 0 %
Lymphocytes Absolute: 1.9 10*3/uL (ref 0.7–3.1)
Lymphs: 18 %
MCH: 29.3 pg (ref 26.6–33.0)
MCHC: 33.3 g/dL (ref 31.5–35.7)
MCV: 88 fL (ref 79–97)
Monocytes Absolute: 0.5 10*3/uL (ref 0.1–0.9)
Monocytes: 5 %
Neutrophils Absolute: 7.7 10*3/uL — ABNORMAL HIGH (ref 1.4–7.0)
Neutrophils: 75 %
Platelets: 273 10*3/uL (ref 150–450)
RBC: 5.16 x10E6/uL (ref 4.14–5.80)
RDW: 13 % (ref 11.6–15.4)
WBC: 10.3 10*3/uL (ref 3.4–10.8)

## 2022-06-02 LAB — LIPID PANEL
Chol/HDL Ratio: 3.6 ratio (ref 0.0–5.0)
Cholesterol, Total: 156 mg/dL (ref 100–199)
HDL: 43 mg/dL (ref >39)
LDL Chol Calc (NIH): 95 mg/dL (ref 0–99)
Triglycerides: 97 mg/dL (ref 0–149)
VLDL Cholesterol Cal: 18 mg/dL (ref 5–40)

## 2022-06-02 LAB — CMP14+EGFR
ALT: 24 IU/L (ref 0–44)
AST: 18 IU/L (ref 0–40)
Albumin/Globulin Ratio: 2.1 (ref 1.2–2.2)
Albumin: 4.1 g/dL (ref 4.1–5.1)
Alkaline Phosphatase: 104 IU/L (ref 44–121)
BUN/Creatinine Ratio: 9 (ref 9–20)
BUN: 8 mg/dL (ref 6–24)
Bilirubin Total: <0.2 mg/dL (ref 0.0–1.2)
CO2: 21 mmol/L (ref 20–29)
Calcium: 8.5 mg/dL — ABNORMAL LOW (ref 8.7–10.2)
Chloride: 103 mmol/L (ref 96–106)
Creatinine, Ser: 0.94 mg/dL (ref 0.76–1.27)
Globulin, Total: 2 g/dL (ref 1.5–4.5)
Glucose: 154 mg/dL — ABNORMAL HIGH (ref 70–99)
Potassium: 4.2 mmol/L (ref 3.5–5.2)
Sodium: 140 mmol/L (ref 134–144)
Total Protein: 6.1 g/dL (ref 6.0–8.5)
eGFR: 104 mL/min/{1.73_m2} (ref >59)

## 2022-06-02 LAB — BAYER DCA HB A1C WAIVED: HB A1C (BAYER DCA - WAIVED): 6.4 % — ABNORMAL HIGH (ref 4.8–5.6)

## 2022-06-02 MED ORDER — ATORVASTATIN CALCIUM 40 MG PO TABS: ORAL_TABLET | ORAL | 3 refills | Status: DC

## 2022-06-02 MED ORDER — AMLODIPINE BESYLATE 10 MG PO TABS: 10.0000 mg | ORAL_TABLET | Freq: Every day | ORAL | 3 refills | Status: DC

## 2022-06-02 NOTE — Patient Instructions (Signed)

## 2022-06-02 NOTE — Progress Notes (Signed)
BP (!) 146/84   Pulse 69   Ht 5\' 8"  (1.727 m)   Wt (!) 314 lb (142.4 kg)   SpO2 95%   BMI 47.74 kg/m    Subjective:   Patient ID: Leonard Rivera, male    DOB: [birth year], 42 y.o.   MRN: [id number]  HPI: Leonard Rivera is a 42 y.o. male presenting on 06/02/2022 for Medical Management of Chronic Issues, Hypertension, and Prediabetes   HPI Prediabetes Patient comes in today for recheck of his diabetes. Patient has been currently taking no medication, diet control at this point, will check A1c today. Patient is not currently on an ACE inhibitor/ARB. Patient has not seen an ophthalmologist this year. Patient denies any issues with their feet. The symptom started onset as an adult hypertension and hyperlipidemia ARE RELATED TO DM   Hypertension Patient is currently on amlodipine, and their blood pressure today is 146/84. Patient denies any lightheadedness or dizziness. Patient denies headaches, blurred vision, chest pains, shortness of breath, or weakness. Denies any side effects from medication and is content with current medication.   Hyperlipidemia Patient is coming in for recheck of his hyperlipidemia. The patient is currently taking atorvastatin. They deny any issues with myalgias or history of liver damage from it. They deny any focal numbness or weakness or chest pain.   Relevant past medical, surgical, family and social history reviewed and updated as indicated. Interim medical history since our last visit reviewed. Allergies and medications reviewed and updated.  Review of Systems  Constitutional:  Negative for chills and fever.  Eyes:  Negative for visual disturbance.  Respiratory:  Negative for shortness of breath and wheezing.   Cardiovascular:  Negative for chest pain and leg swelling.  Skin:  Negative for rash.  Neurological:  Negative for dizziness, light-headedness and headaches.  All other systems reviewed and are negative.   Per HPI unless specifically indicated  above   Allergies as of 06/02/2022   No Known Allergies      Medication List        Accurate as of June 02, 2022 10:56 AM. If you have any questions, ask your nurse or doctor.          allopurinol 100 MG tablet Commonly known as: ZYLOPRIM Take 1 tablet (100 mg total) by mouth daily.   amLODipine 10 MG tablet Commonly known as: NORVASC Take 1 tablet (10 mg total) by mouth daily.   atorvastatin 40 MG tablet Commonly known as: LIPITOR TAKE 1 TABLET BY MOUTH EVERYDAY AT BEDTIME   cetirizine 10 MG tablet Commonly known as: ZYRTEC Take 10 mg by mouth daily.   cyclobenzaprine 10 MG tablet Commonly known as: FLEXERIL Take 1 tablet (10 mg total) by mouth 3 (three) times daily.         Objective:   BP (!) 146/84   Pulse 69   Ht 5\' 8"  (1.727 m)   Wt (!) 314 lb (142.4 kg)   SpO2 95%   BMI 47.74 kg/m   Wt Readings from Last 3 Encounters:  06/02/22 (!) 314 lb (142.4 kg)  05/26/22 (!) 311 lb (141.1 kg)  03/10/22 (!) 307 lb (139.3 kg)    Physical Exam Vitals and nursing note reviewed.  Constitutional:      General: He is not in acute distress.    Appearance: He is well-developed. He is not diaphoretic.  Eyes:     General: No scleral icterus.    Conjunctiva/sclera: Conjunctivae normal.  Neck:  Thyroid: No thyromegaly.  Cardiovascular:     Rate and Rhythm: Normal rate and regular rhythm.     Heart sounds: Normal heart sounds. No murmur heard. Pulmonary:     Effort: Pulmonary effort is normal. No respiratory distress.     Breath sounds: Normal breath sounds. No wheezing.  Musculoskeletal:        General: No swelling. Normal range of motion.     Cervical back: Neck supple.  Lymphadenopathy:     Cervical: No cervical adenopathy.  Skin:    General: Skin is warm and dry.     Findings: No rash.  Neurological:     Mental Status: He is alert and oriented to person, place, and time.     Coordination: Coordination normal.  Psychiatric:        Behavior:  Behavior normal.       Assessment & Plan:   Problem List Items Addressed This Visit       Cardiovascular and Mediastinum   Hypertension   Relevant Medications   amLODipine (NORVASC) 10 MG tablet   atorvastatin (LIPITOR) 40 MG tablet   Other Relevant Orders   CBC with Differential/Platelet   CMP14+EGFR   Lipid panel   Bayer DCA Hb A1c Waived     Other   Prediabetes - Primary   Relevant Orders   CBC with Differential/Platelet   CMP14+EGFR   Lipid panel   Bayer DCA Hb A1c Waived   Pure hypercholesterolemia   Relevant Medications   amLODipine (NORVASC) 10 MG tablet   atorvastatin (LIPITOR) 40 MG tablet   Other Relevant Orders   CBC with Differential/Platelet   CMP14+EGFR   Lipid panel   Bayer DCA Hb A1c Waived   Other Visit Diagnoses     Essential hypertension       Relevant Medications   amLODipine (NORVASC) 10 MG tablet   atorvastatin (LIPITOR) 40 MG tablet       Patient will monitor his blood pressure at home and write down the numbers and bring them with him but he says it is running better at home than here in the office.  Also recommended that he bring his blood pressure cuff with him next time he comes.  Patient's A1c is 6.4 which is slightly up, continue with diet, if continues to creep up that we may have to start medicine in the future. Follow up plan: Return in about 3 months (around 08/31/2022), or if symptoms worsen or fail to improve, for Prediabetes and hypertension recheck.  Counseling provided for all of the vaccine components Orders Placed This Encounter  Procedures   CBC with Differential/Platelet   CMP14+EGFR   Lipid panel   Bayer DCA Hb A1c [place], MD [facility] 06/02/2022, 10:56 AM

## 2022-09-01 ENCOUNTER — Encounter: Payer: Self-pay | Admitting: Family Medicine

## 2022-09-01 ENCOUNTER — Ambulatory Visit (INDEPENDENT_AMBULATORY_CARE_PROVIDER_SITE_OTHER): Payer: 59 | Admitting: Family Medicine

## 2022-09-01 VITALS — BP 133/77 | HR 67 | Ht 68.0 in | Wt 304.0 lb

## 2022-09-01 DIAGNOSIS — E7800 Pure hypercholesterolemia, unspecified: Secondary | ICD-10-CM

## 2022-09-01 DIAGNOSIS — R7303 Prediabetes: Secondary | ICD-10-CM | POA: Diagnosis not present

## 2022-09-01 DIAGNOSIS — M109 Gout, unspecified: Secondary | ICD-10-CM

## 2022-09-01 DIAGNOSIS — I10 Essential (primary) hypertension: Secondary | ICD-10-CM | POA: Diagnosis not present

## 2022-09-01 LAB — BAYER DCA HB A1C WAIVED: HB A1C (BAYER DCA - WAIVED): 6.4 % — ABNORMAL HIGH (ref 4.8–5.6)

## 2022-09-01 MED ORDER — ALLOPURINOL 100 MG PO TABS: 100.0000 mg | ORAL_TABLET | Freq: Every day | ORAL | 3 refills | Status: DC

## 2022-09-01 NOTE — Progress Notes (Signed)
BP 133/77   Pulse 67   Ht 5\' 8"  (1.727 m)   Wt (!) 304 lb (137.9 kg)   SpO2 94%   BMI 46.22 kg/m    Subjective:   Patient ID: Leonard Rivera, male    DOB: 07/23/1980, 42 y.o.   MRN: [id number]  HPI: Leonard Rivera is a 42 y.o. male presenting on 09/01/2022 for Medical Management of Chronic Issues, Hypertension, and Prediabetes   HPI Prediabetes Patient comes in today for recheck of his diabetes. Patient has been currently taking no medication, diet control. Patient is not currently on an ACE inhibitor/ARB. Patient has not seen an ophthalmologist this year. Patient denies any new issues with their feet. The symptom started onset as an adult hypertension and hyperlipidemia ARE RELATED TO DM   Hypertension Patient is currently on amlodipine, and their blood pressure today is 133/77. Patient denies any lightheadedness or dizziness. Patient denies headaches, blurred vision, chest pains, shortness of breath, or weakness. Denies any side effects from medication and is content with current medication.   Hyperlipidemia Patient is coming in for recheck of his hyperlipidemia. The patient is currently taking atorvastatin. They deny any issues with myalgias or history of liver damage from it. They deny any focal numbness or weakness or chest pain.   Gout Last attack: More than a year ago Attacks this year: None Medication: Allopurinol Location of attacks: Ankle and foot on right  Relevant past medical, surgical, family and social history reviewed and updated as indicated. Interim medical history since our last visit reviewed. Allergies and medications reviewed and updated.  Review of Systems  Constitutional:  Negative for chills and fever.  Eyes:  Negative for visual disturbance.  Respiratory:  Negative for shortness of breath and wheezing.   Cardiovascular:  Negative for chest pain and leg swelling.  Musculoskeletal:  Negative for back pain and gait problem.  Skin:  Negative for rash.   Neurological:  Negative for dizziness, weakness and light-headedness.  All other systems reviewed and are negative.   Per HPI unless specifically indicated above   Allergies as of 09/01/2022   No Known Allergies      Medication List        Accurate as of Sep 01, 2022 10:09 AM. If you have any questions, ask your nurse or doctor.          allopurinol 100 MG tablet Commonly known as: ZYLOPRIM Take 1 tablet (100 mg total) by mouth daily.   amLODipine 10 MG tablet Commonly known as: NORVASC Take 1 tablet (10 mg total) by mouth daily.   atorvastatin 40 MG tablet Commonly known as: LIPITOR TAKE 1 TABLET BY MOUTH EVERYDAY AT BEDTIME   cetirizine 10 MG tablet Commonly known as: ZYRTEC Take 10 mg by mouth daily.   cyclobenzaprine 10 MG tablet Commonly known as: FLEXERIL Take 1 tablet (10 mg total) by mouth 3 (three) times daily.         Objective:   BP 133/77   Pulse 67   Ht 5\' 8"  (1.727 m)   Wt (!) 304 lb (137.9 kg)   SpO2 94%   BMI 46.22 kg/m   Wt Readings from Last 3 Encounters:  09/01/22 (!) 304 lb (137.9 kg)  06/02/22 (!) 314 lb (142.4 kg)  05/26/22 (!) 311 lb (141.1 kg)    Physical Exam Vitals and nursing note reviewed.  Constitutional:      General: He is not in acute distress.    Appearance: He is well-developed. He  is not diaphoretic.  Eyes:     General: No scleral icterus.    Conjunctiva/sclera: Conjunctivae normal.  Neck:     Thyroid: No thyromegaly.  Cardiovascular:     Rate and Rhythm: Normal rate and regular rhythm.     Heart sounds: Normal heart sounds. No murmur heard. Pulmonary:     Effort: Pulmonary effort is normal. No respiratory distress.     Breath sounds: Normal breath sounds. No wheezing.  Musculoskeletal:        General: No swelling. Normal range of motion.     Cervical back: Neck supple.  Lymphadenopathy:     Cervical: No cervical adenopathy.  Skin:    General: Skin is warm and dry.     Findings: No rash.   Neurological:     Mental Status: He is alert and oriented to person, place, and time.     Coordination: Coordination normal.  Psychiatric:        Behavior: Behavior normal.       Assessment & Plan:   Problem List Items Addressed This Visit       Cardiovascular and Mediastinum   Hypertension     Other   Prediabetes - Primary   Relevant Orders   Bayer DCA Hb A1c Waived   Pure hypercholesterolemia   Gout  A1c looks good at 6.4, continue current medicine.  Sent refill for allopurinol because of his gout prevention and it seems to be working well.  Blood pressure and everything else looks good today.  Follow up plan: Return in about 3 months (around 12/02/2022), or if symptoms worsen or fail to improve, for Prediabetes hypertension recheck.  Counseling provided for all of the vaccine components Orders Placed This Encounter  Procedures   Bayer DCA Hb A1c Waived    Arville Care, MD [facility] 09/01/2022, 10:09 AM

## 2022-09-24 DIAGNOSIS — M79671 Pain in right foot: Secondary | ICD-10-CM | POA: Diagnosis not present

## 2022-09-24 DIAGNOSIS — M19071 Primary osteoarthritis, right ankle and foot: Secondary | ICD-10-CM | POA: Diagnosis not present

## 2022-09-24 DIAGNOSIS — M10471 Other secondary gout, right ankle and foot: Secondary | ICD-10-CM | POA: Diagnosis not present

## 2022-10-04 ENCOUNTER — Encounter: Payer: Self-pay | Admitting: Nutrition

## 2022-10-04 ENCOUNTER — Encounter: Payer: 59 | Attending: Family Medicine | Admitting: Nutrition

## 2022-10-04 VITALS — Ht 68.0 in | Wt 302.0 lb

## 2022-10-04 DIAGNOSIS — R7303 Prediabetes: Secondary | ICD-10-CM | POA: Insufficient documentation

## 2022-10-04 DIAGNOSIS — E7800 Pure hypercholesterolemia, unspecified: Secondary | ICD-10-CM | POA: Insufficient documentation

## 2022-10-04 NOTE — Progress Notes (Signed)
Start 1100    End 1115  Assessment:  Primary concerns today: Follow up obesity and prediabetes  Changes made: A1C 6.4%. Lost 10 lbs. Walking more; Goes to the GYM 3 times a week. Changes made for eating: working on cutting down on portion size.  Cut back on eating out. He walks a hour a day for about 7000 steps.   Diet still contained processed food of [place] bacon and hot dogs. Diet is low in fresh fruits, and vegetables.  Wt Readings from Last 3 Encounters:  09/01/22 (!) 304 lb (137.9 kg)  06/02/22 (!) 314 lb (142.4 kg)  05/26/22 (!) 311 lb (141.1 kg)   Ht Readings from Last 3 Encounters:  09/01/22 5\' 8"  (1.727 m)  06/02/22 5\' 8"  (1.727 m)  05/26/22 5\' 8"  (1.727 m)   There is no height or weight on file to calculate BMI. @BMIFA @ Facility age limit for growth %iles is 20 years. Facility age limit for growth %iles is 20 years.   There is no height or weight on file to calculate BMI. @BMIFA @ Facility age limit for growth %iles is 20 years. Facility age limit for growth %iles is 20 years.    Latest Ref Rng & Units 06/02/2022   10:37 AM 12/02/2021    1:29 PM 05/25/2021    1:28 PM  CMP  Glucose 70 - 99 mg/dL 161  096  045   BUN 6 - 24 mg/dL 8  13  16    Creatinine 0.76 - 1.27 mg/dL 4.09  8.11  9.14   Sodium 134 - 144 mmol/L 140  139  143   Potassium 3.5 - 5.2 mmol/L 4.2  4.5  5.2   Chloride 96 - 106 mmol/L 103  103  107   CO2 20 - 29 mmol/L 21  20  22    Calcium 8.7 - 10.2 mg/dL 8.5  8.5  9.3   Total Protein 6.0 - 8.5 g/dL 6.1  6.1  6.8   Total Bilirubin 0.0 - 1.2 mg/dL <7.8  <2.9  <5.6   Alkaline Phos 44 - 121 IU/L 104  106  110   AST 0 - 40 IU/L 18  18  21    ALT 0 - 44 IU/L 24  23  29     Lab Results  Component Value Date   HGBA1C 6.4 (H) 09/01/2022   Lipid Panel     Component Value Date/Time   CHOL 156 06/02/2022 1037   TRIG 97 06/02/2022 1037   HDL 43 06/02/2022 1037   CHOLHDL 3.6 06/02/2022 1037   LDLCALC 95 06/02/2022 1037   LABVLDL 18 06/02/2022 1037      Preferred Learning Style:   No preference indicated   Learning Readiness:  Ready Change in progress   MEDICATIONS: see chart   DIETARY INTAKE:  24-hr recall:  B ( AM): 2 egg and 1 slice toast and [place] bacon,  water L)  [place] sandwich on whole wheat bread, water  D  6pm All beef hot dog on bun, grapes,  water 16 oz of water x 2 Beverages:  Water- 5-6 bottles per day'  Usual physical activity: walks alot  Estimated energy needs: 1800  calories 200 g carbohydrates 135 g protein 50 g fat  Progress Towards Goal(s):  In progress.   Nutritional Diagnosis:  NB-1.1 Food and nutrition-related knowledge deficit As related to Obesity.  As evidenced by Obesity.    Intervention:  Nutrition and  Pre Diabetes education provided on Plant based food choices and  lifestyle medicine to improve his health and reduce co morbidities. Low purine diet for his gout.   High Fiber diet and plant based foods.  Goal  Get weight down to 290 lbs Cut out bacon, hot dogs and processed meats Increase fruits, vegetables and foods from a garden.  Lifestyle Medicine - Whole Food, Plant Predominant Nutrition is highly recommended: Eat Plenty of vegetables, Mushrooms, fruits, Legumes, Whole Grains, Nuts, seeds in lieu of processed meats, processed snacks/pastries red meat, poultry, eggs.    -It is better to avoid simple carbohydrates including: Cakes, Sweet Desserts, Ice Cream, Soda (diet and regular), Sweet Tea, Candies, Chips, Cookies, Store Bought Juices, Alcohol in Excess of  1-2 drinks a day, Lemonade,  Artificial Sweeteners, Doughnuts, Coffee Creamers, "Sugar-free" Products, etc, etc.  This is not a complete list.....  Exercise: If you are able: 30 -60 minutes a day ,4 days a week, or 150 minutes a week.  The longer the better.  Combine stretch, strength, and aerobic activities.  If you were told in the past that you have high risk for cardiovascular diseases, you may seek evaluation by your  heart doctor prior to initiating moderate to intense exercise programs.   Teaching Method Utilized:  Visual Auditory Hands on  Handouts given during visit include: Vegetable and fruit shopping list Plate Method  Barriers to learning/adherence to lifestyle change: none  Demonstrated degree of understanding via:  Teach Back   Monitoring/Evaluation:  Dietary intake, exercise,  and body weight in 3 month(s).  Recommend to consider prescribing a GLP-1 for needed weight loss as he tries to implement lifestyle changes.

## 2022-10-04 NOTE — Patient Instructions (Signed)
Goal  Get weight down to 290 lbs Cut out bacon, hot dogs and processed meats Increase fruits, vegetables and foods from a garden.

## 2022-12-02 ENCOUNTER — Ambulatory Visit (INDEPENDENT_AMBULATORY_CARE_PROVIDER_SITE_OTHER): Payer: 59 | Admitting: Family Medicine

## 2022-12-02 ENCOUNTER — Encounter: Payer: Self-pay | Admitting: Family Medicine

## 2022-12-02 VITALS — BP 116/70 | HR 84 | Ht 68.0 in | Wt 310.0 lb

## 2022-12-02 DIAGNOSIS — E7800 Pure hypercholesterolemia, unspecified: Secondary | ICD-10-CM | POA: Diagnosis not present

## 2022-12-02 DIAGNOSIS — R7303 Prediabetes: Secondary | ICD-10-CM | POA: Diagnosis not present

## 2022-12-02 DIAGNOSIS — I10 Essential (primary) hypertension: Secondary | ICD-10-CM | POA: Diagnosis not present

## 2022-12-02 LAB — BAYER DCA HB A1C WAIVED: HB A1C (BAYER DCA - WAIVED): 6.4 % — ABNORMAL HIGH (ref 4.8–5.6)

## 2022-12-02 MED ORDER — CYCLOBENZAPRINE HCL 10 MG PO TABS: 10.0000 mg | ORAL_TABLET | Freq: Three times a day (TID) | ORAL | 2 refills | Status: AC

## 2022-12-02 NOTE — Progress Notes (Signed)
BP 116/70   Pulse 84   Ht 5\' 8"  (1.727 m)   Wt (!) 310 lb (140.6 kg)   SpO2 95%   BMI 47.14 kg/m    Subjective:   Patient ID: Leonard Rivera, male    DOB: [birth year], 42 y.o.   MRN: [id number]  HPI: Leonard Rivera is a 42 y.o. male presenting on 12/02/2022 for Medical Management of Chronic Issues, Diabetes, and Hypertension   HPI Hypertension Patient is currently on amlodipine, and their blood pressure today is 116/70. Patient denies any lightheadedness or dizziness. Patient denies headaches, blurred vision, chest pains, shortness of breath, or weakness. Denies any side effects from medication and is content with current medication.   Hyperlipidemia Patient is coming in for recheck of his hyperlipidemia. The patient is currently taking atorvastatin. They deny any issues with myalgias or history of liver damage from it. They deny any focal numbness or weakness or chest pain.   Prediabetes Patient comes in today for recheck of his diabetes. Patient has been currently taking diet control. Patient is not currently on an ACE inhibitor/ARB. Patient has not seen an ophthalmologist this year. Patient denies any new issues with their feet. The symptom started onset as an adult hypertension and hyperlipidemia ARE RELATED TO DM   Relevant past medical, surgical, family and social history reviewed and updated as indicated. Interim medical history since our last visit reviewed. Allergies and medications reviewed and updated.  Review of Systems  Constitutional:  Negative for chills and fever.  Eyes:  Negative for visual disturbance.  Respiratory:  Negative for shortness of breath and wheezing.   Cardiovascular:  Negative for chest pain and leg swelling.  Musculoskeletal:  Negative for back pain and gait problem.  Skin:  Negative for rash.  Neurological:  Negative for dizziness and light-headedness.  All other systems reviewed and are negative.   Per HPI unless specifically indicated  above   Allergies as of 12/02/2022   No Known Allergies      Medication List        Accurate as of December 02, 2022 11:45 AM. If you have any questions, ask your nurse or doctor.          allopurinol 100 MG tablet Commonly known as: ZYLOPRIM Take 1 tablet (100 mg total) by mouth daily.   amLODipine 10 MG tablet Commonly known as: NORVASC Take 1 tablet (10 mg total) by mouth daily.   atorvastatin 40 MG tablet Commonly known as: LIPITOR TAKE 1 TABLET BY MOUTH EVERYDAY AT BEDTIME   cetirizine 10 MG tablet Commonly known as: ZYRTEC Take 10 mg by mouth daily.   cyclobenzaprine 10 MG tablet Commonly known as: FLEXERIL Take 1 tablet (10 mg total) by mouth 3 (three) times daily.         Objective:   BP 116/70   Pulse 84   Ht 5\' 8"  (1.727 m)   Wt (!) 310 lb (140.6 kg)   SpO2 95%   BMI 47.14 kg/m   Wt Readings from Last 3 Encounters:  12/02/22 (!) 310 lb (140.6 kg)  10/04/22 (!) 302 lb (137 kg)  09/01/22 (!) 304 lb (137.9 kg)    Physical Exam Vitals and nursing note reviewed.  Constitutional:      General: He is not in acute distress.    Appearance: He is well-developed. He is not diaphoretic.  Eyes:     General: No scleral icterus.    Conjunctiva/sclera: Conjunctivae normal.  Neck:     Thyroid:  No thyromegaly.  Cardiovascular:     Rate and Rhythm: Normal rate and regular rhythm.     Heart sounds: Normal heart sounds. No murmur heard. Pulmonary:     Effort: Pulmonary effort is normal. No respiratory distress.     Breath sounds: Normal breath sounds. No wheezing.  Musculoskeletal:        General: Normal range of motion.     Cervical back: Neck supple.  Lymphadenopathy:     Cervical: No cervical adenopathy.  Skin:    General: Skin is warm and dry.     Findings: No rash.  Neurological:     Mental Status: He is alert and oriented to person, place, and time.     Coordination: Coordination normal.  Psychiatric:        Behavior: Behavior normal.        Assessment & Plan:   Problem List Items Addressed This Visit       Cardiovascular and Mediastinum   Hypertension   Relevant Orders   CBC with Differential/Platelet   CMP14+EGFR   Lipid panel   Bayer DCA Hb A1c Waived     Other   Prediabetes - Primary   Relevant Orders   CBC with Differential/Platelet   CMP14+EGFR   Lipid panel   Bayer DCA Hb A1c Waived   Pure hypercholesterolemia   Relevant Orders   CBC with Differential/Platelet   CMP14+EGFR   Lipid panel   Bayer DCA Hb A1c Waived    A1c 6.4 again today, keeps control, instructed to keep active and keep watching diet but seems to be doing well.  No changes Follow up plan: Return in about 3 months (around 03/04/2023), or if symptoms worsen or fail to improve, for Diabetes recheck.  Counseling provided for all of the vaccine components Orders Placed This Encounter  Procedures   CBC with Differential/Platelet   CMP14+EGFR   Lipid panel   Bayer DCA Hb A1c Waived    Arville Care, MD [place] [facility] 12/02/2022, 11:45 AM

## 2023-01-10 ENCOUNTER — Encounter: Payer: 59 | Attending: Family Medicine | Admitting: Nutrition

## 2023-01-10 ENCOUNTER — Encounter: Payer: Self-pay | Admitting: Nutrition

## 2023-01-10 VITALS — Ht 68.0 in | Wt 312.0 lb

## 2023-01-10 DIAGNOSIS — E782 Mixed hyperlipidemia: Secondary | ICD-10-CM

## 2023-01-10 DIAGNOSIS — Z713 Dietary counseling and surveillance: Secondary | ICD-10-CM | POA: Insufficient documentation

## 2023-01-10 DIAGNOSIS — R7303 Prediabetes: Secondary | ICD-10-CM | POA: Diagnosis not present

## 2023-01-10 DIAGNOSIS — I10 Essential (primary) hypertension: Secondary | ICD-10-CM

## 2023-01-10 NOTE — Patient Instructions (Signed)
Goals  Eat an fruit for snack instead of nabs. Increase low carbs with lunch and dinner Walk on the days off 7000 Get A1C down to 6% or less Lose 4 lbs a month Cut down on portions.

## 2023-01-10 NOTE — Progress Notes (Signed)
Start 1100    End 1130  Assessment:  Primary concerns today: Follow up obesity and prediabetes  Changes made:A1C 6.4%. Still. Gained 10 lbs since last visit. Still works for [employer]. Has been trying to still eat healthier. Cut out a lot of processed foods.  Hasn't been walking as much after he works.  Willing to work on portions and cut some unhealthier snacks of nabs and others.   Wt Readings from Last 3 Encounters:  12/02/22 (!) 310 lb (140.6 kg)  10/04/22 (!) 302 lb (137 kg)  09/01/22 (!) 304 lb (137.9 kg)   Ht Readings from Last 3 Encounters:  12/02/22 5\' 8"  (1.727 m)  10/04/22 5\' 8"  (1.727 m)  09/01/22 5\' 8"  (1.727 m)   There is no height or weight on file to calculate BMI. @BMIFA @ Facility age limit for growth %iles is 20 years. Facility age limit for growth %iles is 20 years.   There is no height or weight on file to calculate BMI. @BMIFA @ Facility age limit for growth %iles is 20 years. Facility age limit for growth %iles is 20 years.    Latest Ref Rng & Units 12/02/2022   11:23 AM 06/02/2022   10:37 AM 12/02/2021    1:29 PM  CMP  Glucose 70 - 99 mg/dL 841  660  630   BUN 6 - 24 mg/dL 14  8  13    Creatinine 0.76 - 1.27 mg/dL 1.60  1.09  3.23   Sodium 134 - 144 mmol/L 141  140  139   Potassium 3.5 - 5.2 mmol/L 4.3  4.2  4.5   Chloride 96 - 106 mmol/L 102  103  103   CO2 20 - 29 mmol/L 23  21  20    Calcium 8.7 - 10.2 mg/dL 8.7  8.5  8.5   Total Protein 6.0 - 8.5 g/dL 6.3  6.1  6.1   Total Bilirubin 0.0 - 1.2 mg/dL 0.3  <5.5  <7.3   Alkaline Phos 44 - 121 IU/L 119  104  106   AST 0 - 40 IU/L 15  18  18    ALT 0 - 44 IU/L 30  24  23     Lab Results  Component Value Date   HGBA1C 6.4 (H) 12/02/2022   Lipid Panel     Component Value Date/Time   CHOL 133 12/02/2022 1123   TRIG 129 12/02/2022 1123   HDL 40 12/02/2022 1123   CHOLHDL 3.3 12/02/2022 1123   LDLCALC 70 12/02/2022 1123   LABVLDL 23 12/02/2022 1123     Preferred Learning Style:   No  preference indicated   Learning Readiness:  Ready Change in progress   MEDICATIONS: see chart   DIETARY INTAKE:  24-hr recall:  B  Egg sandwich, water and apple; L)  Baked chicken, corn, green beans, water D   Corn, Pinto beans, water Beverages:  Water- 5-6 bottles per day'  Usual physical activity: Hadn't walked as much.  Estimated energy needs: 1800  calories 200 g carbohydrates 135 g protein 50 g fat  Progress Towards Goal(s):  In progress.   Nutritional Diagnosis:  NB-1.1 Food and nutrition-related knowledge deficit As related to Obesity.  As evidenced by Obesity.    Intervention:  Nutrition and  Pre Diabetes education provided on Plant based food choices and lifestyle medicine to improve his health and reduce co morbidities. Low purine diet for his gout.    Lifefestyle Medicine - Whole Food, Plant Predominant Nutrition is highly recommended: Eat  Plenty of vegetables, Mushrooms, fruits, Legumes, Whole Grains, Nuts, seeds in lieu of processed meats, processed snacks/pastries red meat, poultry, eggs.    -It is better to avoid simple carbohydrates including: Cakes, Sweet Desserts, Ice Cream, Soda (diet and regular), Sweet Tea, Candies, Chips, Cookies, Store Bought Juices, Alcohol in Excess of  1-2 drinks a day, Lemonade,  Artificial Sweeteners, Doughnuts, Coffee Creamers, "Sugar-free" Products, etc, etc.  This is not a complete list.....  Exercise: If you are able: 30 -60 minutes a day ,4 days a week, or 150 minutes a week.  The longer the better.  Combine stretch, strength, and aerobic activities.  If you were told in the past that you have high risk for cardiovascular diseases, you may seek evaluation by your heart doctor prior to initiating moderate to intense exercise programs.  Goals   Eat an fruit for snack instead of nabs. Increase low carbs with lunch and dinner Walk on the days off 7000 steps Get A1C down to 6% or less Lose 4 lbs a month Cut down on  portions.  Teaching Method Utilized:  Visual Auditory Hands on  Handouts given during visit include: Vegetable and fruit shopping list Plate Method  Barriers to learning/adherence to lifestyle change: none  Demonstrated degree of understanding via:  Teach Back   Monitoring/Evaluation:  Dietary intake, exercise,  and body weight in 3 month(s).  Recommend to consider prescribing a GLP-1 for needed weight loss as he tries to implement lifestyle changes.

## 2023-03-04 ENCOUNTER — Ambulatory Visit: Payer: 59 | Admitting: Family Medicine

## 2023-03-22 ENCOUNTER — Ambulatory Visit: Payer: 59

## 2023-03-22 VITALS — Ht 68.0 in | Wt 312.0 lb

## 2023-03-22 DIAGNOSIS — Z0000 Encounter for general adult medical examination without abnormal findings: Secondary | ICD-10-CM

## 2023-03-22 DIAGNOSIS — Z0100 Encounter for examination of eyes and vision without abnormal findings: Secondary | ICD-10-CM

## 2023-03-22 NOTE — Patient Instructions (Addendum)
Mr. Ranard , Thank you for taking time to come for your Medicare Wellness Visit. I appreciate your ongoing commitment to your health goals. Please review the following plan we discussed and let me know if I can assist you in the future.   Referrals/Orders/Follow-Ups/Clinician Recommendations: Aim for 30 minutes of exercise or brisk walking, 6-8 glasses of water, and 5 servings of fruits and vegetables each day.   This is a list of the screening recommended for you and due dates:  Health Maintenance  Topic Date Due   Flu Shot  12/02/2022   COVID-19 Vaccine (3 - 2023-24 season) 01/02/2023   Medicare Annual Wellness Visit  03/21/2024   DTaP/Tdap/Td vaccine (5 - Td or Tdap) 12/31/2025   Hepatitis C Screening  Completed   HIV Screening  Completed   HPV Vaccine  Aged Out    Advanced directives: (Provided) Advance directive discussed with you today. I have provided a copy for you to complete at home and have notarized. Once this is complete, please bring a copy in to our office so we can scan it into your chart. Information on Advanced Care Planning can be found at [facility] [place] Advance Health Care Directives Advance Health Care Directives ([email or web address]    Next Medicare Annual Wellness Visit scheduled for next year: Yes Insert preventive care Attachment reference

## 2023-03-22 NOTE — Progress Notes (Signed)
Subjective:   Leonard Rivera is a 42 y.o. male who presents for Medicare Annual/Subsequent preventive examination.  Visit Complete: Virtual I connected with  Lovie Macadamia on 03/22/23 by a audio enabled telemedicine application and verified that I am speaking with the correct person using two identifiers.  Patient Location: Home  Provider Location: Home Office  I discussed the limitations of evaluation and management by telemedicine. The patient expressed understanding and agreed to proceed.  Vital Signs: Because this visit was a virtual/telehealth visit, some criteria may be missing or patient reported. Any vitals not documented were not able to be obtained and vitals that have been documented are patient reported.  Patient Medicare AWV questionnaire was completed by the patient on 03/22/2023; I have confirmed that all information answered by patient is correct and no changes since this date.  Cardiac Risk Factors include: advanced age (>11men, >34 women);male gender;dyslipidemia;hypertension     Objective:    Today's Vitals   03/22/23 1304  Weight: (!) 312 lb (141.5 kg)  Height: 5\' 8"  (1.727 m)   Body mass index is 47.44 kg/m.     03/22/2023    1:09 PM 12/18/2021    1:22 PM 12/17/2020    1:20 PM 02/05/2020    8:37 AM 12/17/2019   11:15 AM 12/06/2018    3:33 PM 10/25/2018    5:07 PM  Advanced Directives  Does Patient Have a Medical Advance Directive? Yes No Yes No Yes No No  Type of Estate agent of [place];Living will  Healthcare Power of [place];Living will  Healthcare Power of Attorney    Does patient want to make changes to medical advance directive?     No - Patient declined    Copy of Healthcare Power of Attorney in Chart? No - copy requested  No - copy requested      Would patient like information on creating a medical advance directive?  No - Patient declined   No - Patient declined Yes (MAU/Ambulatory/Procedural Areas - Information given) No -  Patient declined    Current Medications (verified) Outpatient Encounter Medications as of 03/22/2023  Medication Sig   allopurinol (ZYLOPRIM) 100 MG tablet Take 1 tablet (100 mg total) by mouth daily.   amLODipine (NORVASC) 10 MG tablet Take 1 tablet (10 mg total) by mouth daily.   atorvastatin (LIPITOR) 40 MG tablet TAKE 1 TABLET BY MOUTH EVERYDAY AT BEDTIME   cetirizine (ZYRTEC) 10 MG tablet Take 10 mg by mouth daily.   cyclobenzaprine (FLEXERIL) 10 MG tablet Take 1 tablet (10 mg total) by mouth 3 (three) times daily.   No facility-administered encounter medications on file as of 03/22/2023.    Allergies (verified) Patient has no known allergies.   History: Past Medical History:  Diagnosis Date   Gout    Hypertension    Morbid obesity (HCC)    History reviewed. No pertinent surgical history. Family History  Problem Relation Age of Onset   Asthma Brother    Social History   Socioeconomic History   Marital status: Single    Spouse name: Not on file   Number of children: Not on file   Years of education: Not on file   Highest education level: 12th grade  Occupational History   Occupation: Disabled  Tobacco Use   Smoking status: Former    Types: Cigars    Quit date: 10/31/2021    Years since quitting: 1.3   Smokeless tobacco: Never   Tobacco comments:    Started  smoking cigars intermittently at age 88 - quit for good 10/2021 (no cigarettes)   Vaping Use   Vaping status: Never Used  Substance and Sexual Activity   Alcohol use: No   Drug use: No   Sexual activity: Yes  Other Topics Concern   Not on file  Social History Narrative   Lives with grandmother - no children   Social Determinants of Health   Financial Resource Strain: Low Risk  (03/22/2023)   Overall Financial Resource Strain (CARDIA)    Difficulty of Paying Living Expenses: Not hard at all  Food Insecurity: No Food Insecurity (03/22/2023)   Hunger Vital Sign    Worried About Running Out of Food in  the Last Year: Never true    Ran Out of Food in the Last Year: Never true  Transportation Needs: No Transportation Needs (03/22/2023)   PRAPARE - Administrator, Civil Service (Medical): No    Lack of Transportation (Non-Medical): No  Physical Activity: Sufficiently Active (03/22/2023)   Exercise Vital Sign    Days of Exercise per Week: 5 days    Minutes of Exercise per Session: 60 min  Stress: No Stress Concern Present (03/22/2023)   [employer] of Occupational Health - Occupational Stress Questionnaire    Feeling of Stress : Not at all  Social Connections: Socially Isolated (03/22/2023)   Social Connection and Isolation Panel [NHANES]    Frequency of Communication with Friends and Family: More than three times a week    Frequency of Social Gatherings with Friends and Family: More than three times a week    Attends Religious Services: Never    Database administrator or Organizations: No    Attends Engineer, structural: Never    Marital Status: Never married    Tobacco Counseling Counseling given: Not Answered Tobacco comments: Started smoking cigars intermittently at age 3 - quit for good 10/2021 (no cigarettes)    Clinical Intake:  Pre-visit preparation completed: Yes  Pain : No/denies pain     Nutritional Risks: None Diabetes: No  How often do you need to have someone help you when you read instructions, pamphlets, or other written materials from your doctor or pharmacy?: 1 - Never  Interpreter Needed?: No  Information entered by :: Renie Ora, LPN   Activities of Daily Living    03/22/2023    1:09 PM  In your present state of health, do you have any difficulty performing the following activities:  Hearing? 0  Vision? 0  Difficulty concentrating or making decisions? 0  Walking or climbing stairs? 0  Dressing or bathing? 0  Doing errands, shopping? 0  Preparing Food and eating ? N  Using the Toilet? N  In the past six months,  have you accidently leaked urine? N  Do you have problems with loss of bowel control? N  Managing your Medications? N  Managing your Finances? N  Housekeeping or managing your Housekeeping? N    Patient Care Team: Dettinger, Elige Radon, MD as PCP - General (Family Medicine) Smitty Cords, OD (Optometry) Mare Loan, RD as Dietitian (Nutrition)  Indicate any recent Medical Services you may have received from other than [facility] providers in the past year (date may be approximate).     Assessment:   This is a routine wellness examination for Prakash.  Hearing/Vision screen Vision Screening - Comments:: Referral 03/22/2023   Goals Addressed             This Visit's  Progress    DIET - EAT MORE FRUITS AND VEGETABLES         Depression Screen    03/22/2023    1:08 PM 12/02/2022   11:32 AM 09/01/2022    9:52 AM 06/02/2022   10:37 AM 02/26/2022   10:34 AM 12/18/2021    1:21 PM 12/02/2021    1:20 PM  PHQ 2/9 Scores  PHQ - 2 Score 0 0 0 0 0 0 0  PHQ- 9 Score  0  0 0 0     Fall Risk    03/22/2023    1:06 PM 12/02/2022   11:31 AM 09/01/2022    9:52 AM 06/02/2022   10:37 AM 02/26/2022   10:35 AM  Fall Risk   Falls in the past year? 0 0 0 0 0  Number falls in past yr: 0    0  Injury with Fall? 0    0  Risk for fall due to : No Fall Risks      Follow up Falls prevention discussed    Falls evaluation completed    MEDICARE RISK AT HOME: Medicare Risk at Home Any stairs in or around the home?: No If so, are there any without handrails?: No Home free of loose throw rugs in walkways, pet beds, electrical cords, etc?: Yes Adequate lighting in your home to reduce risk of falls?: Yes Life alert?: No Use of a cane, walker or w/c?: No Grab bars in the bathroom?: No Shower chair or bench in shower?: No Elevated toilet seat or a handicapped toilet?: No  TIMED UP AND GO:  Was the test performed?  No    Cognitive Function:        03/22/2023    1:09 PM 12/18/2021    1:24 PM  12/17/2019   11:17 AM 12/06/2018    3:34 PM  6CIT Screen  What Year? 0 points 0 points 0 points 0 points  What month? 0 points 0 points 0 points 0 points  What time? 0 points 0 points 0 points 0 points  Count back from 20 0 points 0 points 0 points 0 points  Months in reverse 0 points 4 points 2 points 0 points  Repeat phrase 0 points 0 points 0 points 0 points  Total Score 0 points 4 points 2 points 0 points    Immunizations Immunization History  Administered Date(s) Administered   DTaP 07/14/1981, 09/15/1981, 11/10/1981   Hepatitis B 01/20/1995, 02/24/1995, 08/11/1995   IPV 07/14/1981, 09/15/1981, 11/10/1981   Influenza,inj,Quad PF,6+ Mos 02/01/2016, 02/28/2020, 02/26/2022   MMR 08/31/1982   Moderna Sars-Covid-2 Vaccination 08/10/2019, 09/13/2019   Tdap 01/01/2016    TDAP status: Up to date  Flu Vaccine status: Due, Education has been provided regarding the importance of this vaccine. Advised may receive this vaccine at local pharmacy or Health Dept. Aware to provide a copy of the vaccination record if obtained from local pharmacy or Health Dept. Verbalized acceptance and understanding.  Pneumococcal vaccine status: Due, Education has been provided regarding the importance of this vaccine. Advised may receive this vaccine at local pharmacy or Health Dept. Aware to provide a copy of the vaccination record if obtained from local pharmacy or Health Dept. Verbalized acceptance and understanding.  Covid-19 vaccine status: Completed vaccines  Qualifies for Shingles Vaccine? No   Zostavax completed No   Shingrix Completed?: No.    Education has been provided regarding the importance of this vaccine. Patient has been advised to call insurance company to determine  out of pocket expense if they have not yet received this vaccine. Advised may also receive vaccine at local pharmacy or Health Dept. Verbalized acceptance and understanding.  Screening Tests Health Maintenance  Topic Date Due    INFLUENZA VACCINE  12/02/2022   COVID-19 Vaccine (3 - 2023-24 season) 01/02/2023   Medicare Annual Wellness (AWV)  03/21/2024   DTaP/Tdap/Td (5 - Td or Tdap) 12/31/2025   Hepatitis C Screening  Completed   HIV Screening  Completed   HPV VACCINES  Aged Out    Health Maintenance  Health Maintenance Due  Topic Date Due   INFLUENZA VACCINE  12/02/2022   COVID-19 Vaccine (3 - 2023-24 season) 01/02/2023    Colorectal cancer screening: Referral to GI placed not of age . Pt aware the office will call re: appt.  Lung Cancer Screening: (Low Dose CT Chest recommended if Age 51-80 years, 20 pack-year currently smoking OR have quit w/in 15years.) does not qualify.   Lung Cancer Screening Referral: n/a  Additional Screening:  Hepatitis C Screening: does not qualify; Completed 02/28/2020  Vision Screening: Recommended annual ophthalmology exams for early detection of glaucoma and other disorders of the eye. Is the patient up to date with their annual eye exam?  No  Who is the provider or what is the name of the office in which the patient attends annual eye exams? None referral 03/22/2023 If pt is not established with a provider, would they like to be referred to a provider to establish care? Yes .   Dental Screening: Recommended annual dental exams for proper oral hygiene   Community Resource Referral / Chronic Care Management: CRR required this visit?  No   CCM required this visit?  No     Plan:     I have personally reviewed and noted the following in the patient's chart:   Medical and social history Use of alcohol, tobacco or illicit drugs  Current medications and supplements including opioid prescriptions. Patient is not currently taking opioid prescriptions. Functional ability and status Nutritional status Physical activity Advanced directives List of other physicians Hospitalizations, surgeries, and ER visits in previous 12 months Vitals Screenings to include  cognitive, depression, and falls Referrals and appointments  In addition, I have reviewed and discussed with patient certain preventive protocols, quality metrics, and best practice recommendations. A written personalized care plan for preventive services as well as general preventive health recommendations were provided to patient.     Lorrene Reid, LPN   19/14/7829   After Visit Summary: (MyChart) Due to this being a telephonic visit, the after visit summary with patients personalized plan was offered to patient via MyChart   Nurse Notes: Due Flu Vaccine

## 2023-03-30 ENCOUNTER — Ambulatory Visit: Payer: 59 | Admitting: Family Medicine

## 2023-03-30 DIAGNOSIS — R7303 Prediabetes: Secondary | ICD-10-CM

## 2023-04-05 ENCOUNTER — Encounter: Payer: Self-pay | Admitting: Family Medicine

## 2023-04-13 ENCOUNTER — Encounter: Payer: 59 | Attending: Family Medicine | Admitting: Nutrition

## 2023-04-13 VITALS — Ht 68.0 in | Wt 319.0 lb

## 2023-04-13 DIAGNOSIS — I10 Essential (primary) hypertension: Secondary | ICD-10-CM | POA: Diagnosis present

## 2023-04-13 DIAGNOSIS — E782 Mixed hyperlipidemia: Secondary | ICD-10-CM

## 2023-04-13 DIAGNOSIS — Z713 Dietary counseling and surveillance: Secondary | ICD-10-CM | POA: Insufficient documentation

## 2023-04-13 DIAGNOSIS — R7303 Prediabetes: Secondary | ICD-10-CM | POA: Diagnosis not present

## 2023-04-13 NOTE — Progress Notes (Unsigned)
Start 1100    End 1130  Assessment:  Primary concerns today: Follow up obesity and prediabetes Hasn't seen PCP yet. Changes:Fell off the wagon   Changes made:A1C 6.4%. Still. Gained 10 lbs since last visit. Still works for [employer]. Has been trying to still eat healthier. Cut out a lot of processed foods.  Hasn't been walking as much after he works.  Willing to work on portions and cut some unhealthier snacks of nabs and others. Doesn't drink -quit smoking,  or smoke any more.  Wt Readings from Last 3 Encounters:  03/22/23 (!) 312 lb (141.5 kg)  01/10/23 (!) 312 lb (141.5 kg)  12/02/22 (!) 310 lb (140.6 kg)   Ht Readings from Last 3 Encounters:  03/22/23 5\' 8"  (1.727 m)  01/10/23 5\' 8"  (1.727 m)  12/02/22 5\' 8"  (1.727 m)   There is no height or weight on file to calculate BMI. @BMIFA @ Facility age limit for growth %iles is 20 years. Facility age limit for growth %iles is 20 years.   There is no height or weight on file to calculate BMI. @BMIFA @ Facility age limit for growth %iles is 20 years. Facility age limit for growth %iles is 20 years.    Latest Ref Rng & Units 12/02/2022   11:23 AM 06/02/2022   10:37 AM 12/02/2021    1:29 PM  CMP  Glucose 70 - 99 mg/dL 469  629  528   BUN 6 - 24 mg/dL 14  8  13    Creatinine 0.76 - 1.27 mg/dL 4.13  2.44  0.10   Sodium 134 - 144 mmol/L 141  140  139   Potassium 3.5 - 5.2 mmol/L 4.3  4.2  4.5   Chloride 96 - 106 mmol/L 102  103  103   CO2 20 - 29 mmol/L 23  21  20    Calcium 8.7 - 10.2 mg/dL 8.7  8.5  8.5   Total Protein 6.0 - 8.5 g/dL 6.3  6.1  6.1   Total Bilirubin 0.0 - 1.2 mg/dL 0.3  <2.7  <2.5   Alkaline Phos 44 - 121 IU/L 119  104  106   AST 0 - 40 IU/L 15  18  18    ALT 0 - 44 IU/L 30  24  23     Lab Results  Component Value Date   HGBA1C 6.4 (H) 12/02/2022   Lipid Panel     Component Value Date/Time   CHOL 133 12/02/2022 1123   TRIG 129 12/02/2022 1123   HDL 40 12/02/2022 1123   CHOLHDL 3.3 12/02/2022 1123    LDLCALC 70 12/02/2022 1123   LABVLDL 23 12/02/2022 1123    Preferred Learning Style:   No preference indicated   Learning Readiness:  Ready Change in progress   MEDICATIONS: see chart   DIETARY INTAKE:  24-hr recall:  B  Protein shake L)  CHerrios for lunch 1 cup,  D   baked chicken, corn, green beans and wheat bread, water Beverages:  Water- 5-6 bottles per day'  Usual physical activity: Hadn't walked as much.  Estimated energy needs: 1800  calories 200 g carbohydrates 135 g protein 50 g fat  Progress Towards Goal(s):  In progress.   Nutritional Diagnosis:  NB-1.1 Food and nutrition-related knowledge deficit As related to Obesity.  As evidenced by Obesity.    Intervention:  Nutrition and  Pre Diabetes education provided on Plant based food choices and lifestyle medicine to improve his health and reduce co morbidities. Low purine diet  for his gout.    Lifefestyle Medicine - Whole Food, Plant Predominant Nutrition is highly recommended: Eat Plenty of vegetables, Mushrooms, fruits, Legumes, Whole Grains, Nuts, seeds in lieu of processed meats, processed snacks/pastries red meat, poultry, eggs.    -It is better to avoid simple carbohydrates including: Cakes, Sweet Desserts, Ice Cream, Soda (diet and regular), Sweet Tea, Candies, Chips, Cookies, Store Bought Juices, Alcohol in Excess of  1-2 drinks a day, Lemonade,  Artificial Sweeteners, Doughnuts, Coffee Creamers, "Sugar-free" Products, etc, etc.  This is not a complete list.....  Exercise: If you are able: 30 -60 minutes a day ,4 days a week, or 150 minutes a week.  The longer the better.  Combine stretch, strength, and aerobic activities.  If you were told in the past that you have high risk for cardiovascular diseases, you may seek evaluation by your heart doctor prior to initiating moderate to intense exercise programs.  Goals   Eat an fruit for snack instead of nabs. Increase low carbs with lunch and dinner Walk  on the days off 7000 steps Get A1C down to 6% or less Lose 4 lbs a month Cut down on portions.  Teaching Method Utilized:  Visual Auditory Hands on  Handouts given during visit include: Vegetable and fruit shopping list Plate Method  Barriers to learning/adherence to lifestyle change: none  Demonstrated degree of understanding via:  Teach Back   Monitoring/Evaluation:  Dietary intake, exercise,  and body weight in 3 month(s).  Recommend to consider prescribing a GLP-1 for needed weight loss as he tries to implement lifestyle changes.

## 2023-04-13 NOTE — Patient Instructions (Addendum)
Goals Will start going back to gym 4 days a week for 1 hour. Increase to eating 3 piece of fruit per day  and 4 servings of vegetables per day. Lose 1 lb a per week Talk to MD about weight loss medication of GLP-1

## 2023-05-17 ENCOUNTER — Encounter: Payer: Self-pay | Admitting: Nutrition

## 2023-07-05 ENCOUNTER — Ambulatory Visit: Payer: 59 | Admitting: Nutrition

## 2023-07-20 ENCOUNTER — Other Ambulatory Visit: Payer: Self-pay | Admitting: Family Medicine

## 2023-07-20 DIAGNOSIS — I10 Essential (primary) hypertension: Secondary | ICD-10-CM

## 2023-07-22 ENCOUNTER — Other Ambulatory Visit: Payer: Self-pay | Admitting: Family Medicine

## 2023-08-10 ENCOUNTER — Encounter: Payer: Self-pay | Admitting: Family Medicine

## 2023-08-10 ENCOUNTER — Telehealth: Payer: Self-pay

## 2023-08-10 ENCOUNTER — Ambulatory Visit (INDEPENDENT_AMBULATORY_CARE_PROVIDER_SITE_OTHER): Admitting: Family Medicine

## 2023-08-10 VITALS — BP 135/80 | HR 84 | Ht 68.0 in | Wt 326.0 lb

## 2023-08-10 DIAGNOSIS — E7800 Pure hypercholesterolemia, unspecified: Secondary | ICD-10-CM

## 2023-08-10 DIAGNOSIS — R7303 Prediabetes: Secondary | ICD-10-CM | POA: Diagnosis not present

## 2023-08-10 DIAGNOSIS — E1169 Type 2 diabetes mellitus with other specified complication: Secondary | ICD-10-CM | POA: Diagnosis not present

## 2023-08-10 DIAGNOSIS — I10 Essential (primary) hypertension: Secondary | ICD-10-CM

## 2023-08-10 LAB — BAYER DCA HB A1C WAIVED: HB A1C (BAYER DCA - WAIVED): 7.3 % — ABNORMAL HIGH (ref 4.8–5.6)

## 2023-08-10 MED ORDER — ATORVASTATIN CALCIUM 40 MG PO TABS: 40.0000 mg | ORAL_TABLET | Freq: Every day | ORAL | 3 refills | Status: DC

## 2023-08-10 MED ORDER — AMLODIPINE BESYLATE 10 MG PO TABS: 10.0000 mg | ORAL_TABLET | Freq: Every day | ORAL | 3 refills | Status: DC

## 2023-08-10 MED ORDER — ALLOPURINOL 100 MG PO TABS: 100.0000 mg | ORAL_TABLET | Freq: Every day | ORAL | 3 refills | Status: DC

## 2023-08-10 MED ORDER — SEMAGLUTIDE(0.25 OR 0.5MG/DOS) 2 MG/3ML ~~LOC~~ SOPN: PEN_INJECTOR | SUBCUTANEOUS | 1 refills | Status: DC

## 2023-08-10 NOTE — Telephone Encounter (Signed)
 Pharmacy Patient Advocate Encounter   Received notification from CoverMyMeds that prior authorization for Ozempic (0.25 or 0.5 MG/DOSE) 2MG /3ML pen-injectors is required/requested.   Insurance verification completed.   The patient is insured through [facility] .   Per test claim: PA required; PA submitted to above mentioned insurance via CoverMyMeds Key/confirmation #/EOC [id number] Status is pending

## 2023-08-10 NOTE — Progress Notes (Signed)
 BP 135/80   Pulse 84   Ht 5\' 8"  (1.727 m)   Wt (!) 326 lb (147.9 kg)   SpO2 95%   BMI 49.57 kg/m    Subjective:   Patient ID: Leonard Rivera, male    DOB: 1980/08/17, 43 y.o.   MRN: [id number]  HPI: Leonard Rivera is a 43 y.o. male presenting on 08/10/2023 for No chief complaint on file.   HPI Type 2 diabetes mellitus Patient comes in today for recheck of his diabetes. Patient has been currently taking no medicine, he did go over and see a dietitian.. Patient is not currently on an ACE inhibitor/ARB. Patient has not seen an ophthalmologist this year. Patient denies any new issues with their feet. The symptom started onset as an adult hypertension and hyperlipidemia ARE RELATED TO DM   Hypertension Patient is currently on amlodipine, and their blood pressure today is 135/80. Patient denies any lightheadedness or dizziness. Patient denies headaches, blurred vision, chest pains, shortness of breath, or weakness. Denies any side effects from medication and is content with current medication.   Hyperlipidemia Patient is coming in for recheck of his hyperlipidemia. The patient is currently taking atorvastatin. They deny any issues with myalgias or history of liver damage from it. They deny any focal numbness or weakness or chest pain.   Relevant past medical, surgical, family and social history reviewed and updated as indicated. Interim medical history since our last visit reviewed. Allergies and medications reviewed and updated.  Review of Systems  Constitutional:  Negative for chills and fever.  Eyes:  Negative for visual disturbance.  Respiratory:  Negative for shortness of breath and wheezing.   Cardiovascular:  Negative for chest pain and leg swelling.  Musculoskeletal:  Negative for back pain and gait problem.  Skin:  Negative for rash.  Neurological:  Negative for dizziness and light-headedness.  All other systems reviewed and are negative.   Per HPI unless specifically  indicated above   Allergies as of 08/10/2023   No Known Allergies      Medication List        Accurate as of August 10, 2023  1:36 PM. If you have any questions, ask your nurse or doctor.          allopurinol 100 MG tablet Commonly known as: ZYLOPRIM Take 1 tablet (100 mg total) by mouth daily.   amLODipine 10 MG tablet Commonly known as: NORVASC Take 1 tablet (10 mg total) by mouth daily. What changed: additional instructions Changed by: Elige Radon Kwaku Mostafa   atorvastatin 40 MG tablet Commonly known as: LIPITOR Take 1 tablet (40 mg total) by mouth at bedtime. What changed:  how much to take how to take this when to take this additional instructions Changed by: Elige Radon [place] Frieden   cetirizine 10 MG tablet Commonly known as: ZYRTEC Take 10 mg by mouth daily.   cyclobenzaprine 10 MG tablet Commonly known as: FLEXERIL Take 1 tablet (10 mg total) by mouth 3 (three) times daily.   Semaglutide(0.25 or 0.5MG /DOS) 2 MG/3ML Sopn Inject 0.25 mg into the skin once a week for 28 days, THEN 0.5 mg once a week. Start taking on: August 10, 2023 Started by: Ivin Booty A Merrik Puebla         Objective:   BP 135/80   Pulse 84   Ht 5\' 8"  (1.727 m)   Wt (!) 326 lb (147.9 kg)   SpO2 95%   BMI 49.57 kg/m   Wt Readings from Last 3 Encounters:  08/10/23 (!) 326 lb (147.9 kg)  04/13/23 (!) 319 lb (144.7 kg)  03/22/23 (!) 312 lb (141.5 kg)    Physical Exam Vitals and nursing note reviewed.  Constitutional:      General: He is not in acute distress.    Appearance: He is well-developed. He is not diaphoretic.  Eyes:     General: No scleral icterus.    Conjunctiva/sclera: Conjunctivae normal.  Neck:     Thyroid: No thyromegaly.  Cardiovascular:     Rate and Rhythm: Normal rate and regular rhythm.     Heart sounds: Normal heart sounds. No murmur heard. Pulmonary:     Effort: Pulmonary effort is normal. No respiratory distress.     Breath sounds: Normal breath sounds. No  wheezing.  Musculoskeletal:        General: No swelling. Normal range of motion.     Cervical back: Neck supple.  Lymphadenopathy:     Cervical: No cervical adenopathy.  Skin:    General: Skin is warm and dry.     Findings: No rash.  Neurological:     Mental Status: He is alert and oriented to person, place, and time.     Coordination: Coordination normal.  Psychiatric:        Behavior: Behavior normal.       Assessment & Plan:   Problem List Items Addressed This Visit       Cardiovascular and Mediastinum   Hypertension   Relevant Medications   amLODipine (NORVASC) 10 MG tablet   atorvastatin (LIPITOR) 40 MG tablet   Other Relevant Orders   CBC with Differential/Platelet   CMP14+EGFR   Lipid panel   Bayer DCA Hb A1c Waived     Endocrine   Type 2 diabetes mellitus with other specified complication (HCC) - Primary   Relevant Medications   atorvastatin (LIPITOR) 40 MG tablet   Semaglutide,0.25 or 0.5MG /DOS, 2 MG/3ML SOPN     Other   Pure hypercholesterolemia   Relevant Medications   amLODipine (NORVASC) 10 MG tablet   atorvastatin (LIPITOR) 40 MG tablet   Other Relevant Orders   CBC with Differential/Platelet   CMP14+EGFR   Lipid panel   Bayer DCA Hb A1c Waived   Other Visit Diagnoses       Essential hypertension       Relevant Medications   amLODipine (NORVASC) 10 MG tablet   atorvastatin (LIPITOR) 40 MG tablet       A1c is up at 7.3, will try and start Ozempic which should help with diabetes and weight loss. Follow up plan: Return in about 3 months (around 11/09/2023), or if symptoms worsen or fail to improve, for Diabetes recheck.  Counseling provided for all of the vaccine components Orders Placed This Encounter  Procedures   CBC with Differential/Platelet   CMP14+EGFR   Lipid panel   Bayer DCA Hb A1c Waived    Arville Care, MD [facility] 08/10/2023, 1:36 PM

## 2023-08-11 ENCOUNTER — Telehealth: Payer: Self-pay

## 2023-08-11 ENCOUNTER — Other Ambulatory Visit (HOSPITAL_COMMUNITY): Payer: Self-pay

## 2023-08-11 LAB — CBC WITH DIFFERENTIAL/PLATELET
Basophils Absolute: 0.1 10*3/uL (ref 0.0–0.2)
Basos: 1 %
EOS (ABSOLUTE): 0.4 10*3/uL (ref 0.0–0.4)
Eos: 4 %
Hematocrit: 45 % (ref 37.5–51.0)
Hemoglobin: 15.2 g/dL (ref 13.0–17.7)
Immature Grans (Abs): 0 10*3/uL (ref 0.0–0.1)
Immature Granulocytes: 0 %
Lymphocytes Absolute: 2.6 10*3/uL (ref 0.7–3.1)
Lymphs: 28 %
MCH: 29.5 pg (ref 26.6–33.0)
MCHC: 33.8 g/dL (ref 31.5–35.7)
MCV: 87 fL (ref 79–97)
Monocytes Absolute: 0.6 10*3/uL (ref 0.1–0.9)
Monocytes: 6 %
Neutrophils Absolute: 5.5 10*3/uL (ref 1.4–7.0)
Neutrophils: 61 %
Platelets: 265 10*3/uL (ref 150–450)
RBC: 5.15 x10E6/uL (ref 4.14–5.80)
RDW: 13.6 % (ref 11.6–15.4)
WBC: 9.1 10*3/uL (ref 3.4–10.8)

## 2023-08-11 LAB — CMP14+EGFR
ALT: 23 IU/L (ref 0–44)
AST: 18 IU/L (ref 0–40)
Albumin: 3.9 g/dL — ABNORMAL LOW (ref 4.1–5.1)
Alkaline Phosphatase: 109 IU/L (ref 44–121)
BUN/Creatinine Ratio: 9 (ref 9–20)
BUN: 9 mg/dL (ref 6–24)
Bilirubin Total: <0.2 mg/dL (ref 0.0–1.2)
CO2: 25 mmol/L (ref 20–29)
Calcium: 8.7 mg/dL (ref 8.7–10.2)
Chloride: 103 mmol/L (ref 96–106)
Creatinine, Ser: 1.04 mg/dL (ref 0.76–1.27)
Globulin, Total: 2.4 g/dL (ref 1.5–4.5)
Glucose: 125 mg/dL — ABNORMAL HIGH (ref 70–99)
Potassium: 4.4 mmol/L (ref 3.5–5.2)
Sodium: 141 mmol/L (ref 134–144)
Total Protein: 6.3 g/dL (ref 6.0–8.5)
eGFR: 92 mL/min/{1.73_m2} (ref >59)

## 2023-08-11 LAB — LIPID PANEL
Chol/HDL Ratio: 4.3 ratio (ref 0.0–5.0)
Cholesterol, Total: 160 mg/dL (ref 100–199)
HDL: 37 mg/dL — ABNORMAL LOW (ref >39)
LDL Chol Calc (NIH): 90 mg/dL (ref 0–99)
Triglycerides: 195 mg/dL — ABNORMAL HIGH (ref 0–149)
VLDL Cholesterol Cal: 33 mg/dL (ref 5–40)

## 2023-08-11 NOTE — Telephone Encounter (Signed)
 Pharmacy Patient Advocate Encounter  Received notification from [facility] that Prior Authorization for Ozempic (0.25 or 0.5 MG/DOSE) 2MG /3ML pen-injectors  has been APPROVED from 08/10/23 to 05/02/24. Unable to obtain price due to refill too soon rejection, last fill date 08/11/23 next available fill date6/12/25   PA #/Case ID/Reference #: [id number]

## 2023-08-11 NOTE — Telephone Encounter (Signed)
 Copied from CRM [id number]. Topic: Clinical - Prescription Issue >> Aug 11, 2023 12:00 PM Shelah Lewandowsky wrote: Reason for CRM: Semaglutide,0.25 or 0.5MG /DOS, 2 MG/3ML SOPN needs prior authorization  please call patient with any issues [phone]

## 2023-08-11 NOTE — Telephone Encounter (Signed)
 PA request has been Approved. New Encounter has been or will be created for follow up. For additional info see Pharmacy Prior Auth telephone encounter from 08/10/23.

## 2023-08-12 ENCOUNTER — Encounter: Payer: Self-pay | Admitting: Family Medicine

## 2023-10-12 ENCOUNTER — Telehealth: Payer: Self-pay | Admitting: Family Medicine

## 2023-10-18 LAB — MICROALBUMIN / CREATININE URINE RATIO: Microalb Creat Ratio: <30

## 2023-11-11 ENCOUNTER — Encounter: Payer: Self-pay | Admitting: Family Medicine

## 2023-11-11 ENCOUNTER — Ambulatory Visit (INDEPENDENT_AMBULATORY_CARE_PROVIDER_SITE_OTHER): Admitting: *Deleted

## 2023-11-11 ENCOUNTER — Ambulatory Visit: Admitting: Family Medicine

## 2023-11-11 VITALS — BP 129/82 | HR 77 | Ht 68.0 in | Wt 315.0 lb

## 2023-11-11 DIAGNOSIS — E1169 Type 2 diabetes mellitus with other specified complication: Secondary | ICD-10-CM

## 2023-11-11 DIAGNOSIS — Z7985 Long-term (current) use of injectable non-insulin antidiabetic drugs: Secondary | ICD-10-CM | POA: Diagnosis not present

## 2023-11-11 DIAGNOSIS — E1159 Type 2 diabetes mellitus with other circulatory complications: Secondary | ICD-10-CM | POA: Diagnosis not present

## 2023-11-11 DIAGNOSIS — I152 Hypertension secondary to endocrine disorders: Secondary | ICD-10-CM

## 2023-11-11 DIAGNOSIS — E785 Hyperlipidemia, unspecified: Secondary | ICD-10-CM | POA: Diagnosis not present

## 2023-11-11 LAB — HM DIABETES EYE EXAM

## 2023-11-11 LAB — BAYER DCA HB A1C WAIVED: HB A1C (BAYER DCA - WAIVED): 6.6 % — ABNORMAL HIGH (ref 4.8–5.6)

## 2023-11-11 MED ORDER — SEMAGLUTIDE (1 MG/DOSE) 4 MG/3ML ~~LOC~~ SOPN: 1.0000 mg | PEN_INJECTOR | SUBCUTANEOUS | 1 refills | Status: DC

## 2023-11-11 NOTE — Progress Notes (Signed)
 BP 129/82   Pulse 77   Ht 5' 8 (1.727 m)   Wt (!) 315 lb (142.9 kg)   SpO2 94%   BMI 47.90 kg/m    Subjective:   Patient ID: Leonard Rivera, male    DOB: 23-Mar-1981, 43 y.o.   MRN: [id number]  HPI: Leonard Rivera is a 43 y.o. male presenting on 11/11/2023 for Medical Management of Chronic Issues, Diabetes, and Hypertension   HPI Type 2 diabetes mellitus Patient comes in today for recheck of his diabetes. Patient has been currently taking Ozempic . Patient is not currently on an ACE inhibitor/ARB. Patient has seen an ophthalmologist this year. Patient denies any new issues with their feet. The symptom started onset as an adult hypertension and hyperlipidemia ARE RELATED TO DM   Hypertension Patient is currently on amlodipine , and their blood pressure today is 129/82. Patient denies any lightheadedness or dizziness. Patient denies headaches, blurred vision, chest pains, shortness of breath, or weakness. Denies any side effects from medication and is content with current medication.   Hyperlipidemia Patient is coming in for recheck of his hyperlipidemia. The patient is currently taking atorvastatin . They deny any issues with myalgias or history of liver damage from it. They deny any focal numbness or weakness or chest pain.   Relevant past medical, surgical, family and social history reviewed and updated as indicated. Interim medical history since our last visit reviewed. Allergies and medications reviewed and updated.  Review of Systems  Constitutional:  Negative for chills and fever.  Eyes:  Negative for visual disturbance.  Respiratory:  Negative for shortness of breath and wheezing.   Cardiovascular:  Negative for chest pain and leg swelling.  Skin:  Negative for rash.  Neurological:  Negative for dizziness and light-headedness.  All other systems reviewed and are negative.   Per HPI unless specifically indicated above   Allergies as of 11/11/2023   No Known Allergies       Medication List        Accurate as of November 11, 2023  2:49 PM. If you have any questions, ask your nurse or doctor.          STOP taking these medications    Semaglutide (0.25 or 0.5MG /DOS) 2 MG/3ML Sopn Replaced by: Semaglutide  (1 MG/DOSE) 4 MG/3ML Sopn Stopped by: Fonda LABOR Lane Kjos       TAKE these medications    allopurinol  100 MG tablet Commonly known as: ZYLOPRIM  Take 1 tablet (100 mg total) by mouth daily.   amLODipine  10 MG tablet Commonly known as: NORVASC  Take 1 tablet (10 mg total) by mouth daily.   atorvastatin  40 MG tablet Commonly known as: LIPITOR Take 1 tablet (40 mg total) by mouth at bedtime.   cetirizine 10 MG tablet Commonly known as: ZYRTEC Take 10 mg by mouth daily.   cyclobenzaprine  10 MG tablet Commonly known as: FLEXERIL  Take 1 tablet (10 mg total) by mouth 3 (three) times daily.   Semaglutide  (1 MG/DOSE) 4 MG/3ML Sopn Inject 1 mg as directed once a week. Replaces: Semaglutide (0.25 or 0.5MG /DOS) 2 MG/3ML Sopn Started by: Fonda LABOR Meilyn Heindl         Objective:   BP 129/82   Pulse 77   Ht 5' 8 (1.727 m)   Wt (!) 315 lb (142.9 kg)   SpO2 94%   BMI 47.90 kg/m   Wt Readings from Last 3 Encounters:  11/11/23 (!) 315 lb (142.9 kg)  08/10/23 (!) 326 lb (147.9 kg)  04/13/23 (!) 319 lb (  144.7 kg)    Physical Exam Vitals and nursing note reviewed.  Constitutional:      General: He is not in acute distress.    Appearance: He is well-developed. He is not diaphoretic.  Eyes:     General: No scleral icterus.    Conjunctiva/sclera: Conjunctivae normal.  Neck:     Thyroid : No thyromegaly.  Cardiovascular:     Rate and Rhythm: Normal rate and regular rhythm.     Heart sounds: Normal heart sounds. No murmur heard. Pulmonary:     Effort: Pulmonary effort is normal. No respiratory distress.     Breath sounds: Normal breath sounds. No wheezing.  Musculoskeletal:        General: No swelling. Normal range of motion.     Cervical  back: Neck supple.  Lymphadenopathy:     Cervical: No cervical adenopathy.  Skin:    General: Skin is warm and dry.     Findings: No rash.  Neurological:     Mental Status: He is alert and oriented to person, place, and time.     Coordination: Coordination normal.  Psychiatric:        Behavior: Behavior normal.     Diabetic Foot Exam - Simple   Simple Foot Form Diabetic Foot exam was performed with the following findings: Yes 11/11/2023  2:49 PM  Visual Inspection No deformities, no ulcerations, no other skin breakdown bilaterally: Yes Sensation Testing Intact to touch and monofilament testing bilaterally: Yes Pulse Check Posterior Tibialis and Dorsalis pulse intact bilaterally: Yes Comments      Assessment & Plan:   Problem List Items Addressed This Visit       Cardiovascular and Mediastinum   Hypertension associated with diabetes (HCC)   Relevant Medications   Semaglutide , 1 MG/DOSE, 4 MG/3ML SOPN     Endocrine   Type 2 diabetes mellitus with other specified complication (HCC) - Primary   Relevant Medications   Semaglutide , 1 MG/DOSE, 4 MG/3ML SOPN   Other Relevant Orders   Bayer DCA Hb A1c Waived   Microalbumin/Creatinine Ratio, Urine   Hyperlipidemia associated with type 2 diabetes mellitus (HCC)   Relevant Medications   Semaglutide , 1 MG/DOSE, 4 MG/3ML SOPN    A1c 6.6, blood pressure is 129/82 looks good.  Seems to be doing well, no changes.  Will increase his Ozempic  to have better control of his diabetes and to help with weight loss. Follow up plan: Return in about 3 months (around 02/11/2024), or if symptoms worsen or fail to improve, for Diabetes and hypertension.  Counseling provided for all of the vaccine components Orders Placed This Encounter  Procedures   Bayer DCA Hb A1c Waived   Microalbumin/Creatinine Ratio, Urine    Fonda Levins, MD [facility] 11/11/2023, 2:49 PM

## 2023-11-11 NOTE — Progress Notes (Unsigned)
 Arrived on 11/11/2023 and has given verbal consent to obtain images and complete their overdue diabetic retinal screening.  The images have been sent to an ophthalmologist or optometrist for review and interpretation.  Results will be sent back to [facility] for review.  Patient has been informed they will be contacted when we receive the results via telephone or MyChart.

## 2023-11-17 ENCOUNTER — Encounter: Payer: Self-pay | Admitting: *Deleted

## 2023-12-15 DIAGNOSIS — S9032XA Contusion of left foot, initial encounter: Secondary | ICD-10-CM | POA: Diagnosis not present

## 2023-12-26 ENCOUNTER — Emergency Department (HOSPITAL_COMMUNITY)

## 2023-12-26 ENCOUNTER — Other Ambulatory Visit: Payer: Self-pay

## 2023-12-26 ENCOUNTER — Encounter (HOSPITAL_COMMUNITY): Payer: Self-pay | Admitting: Emergency Medicine

## 2023-12-26 ENCOUNTER — Emergency Department (HOSPITAL_COMMUNITY)
Admission: EM | Admit: 2023-12-26 | Discharge: 2023-12-26 | Disposition: A | Discharge status: Home / Self Care | Attending: Emergency Medicine | Admitting: Emergency Medicine

## 2023-12-26 DIAGNOSIS — S299XXA Unspecified injury of thorax, initial encounter: Secondary | ICD-10-CM | POA: Diagnosis not present

## 2023-12-26 DIAGNOSIS — Z23 Encounter for immunization: Secondary | ICD-10-CM | POA: Diagnosis not present

## 2023-12-26 DIAGNOSIS — E119 Type 2 diabetes mellitus without complications: Secondary | ICD-10-CM | POA: Diagnosis not present

## 2023-12-26 DIAGNOSIS — W19XXXA Unspecified fall, initial encounter: Secondary | ICD-10-CM | POA: Diagnosis not present

## 2023-12-26 DIAGNOSIS — W11XXXA Fall on and from ladder, initial encounter: Secondary | ICD-10-CM | POA: Insufficient documentation

## 2023-12-26 DIAGNOSIS — I10 Essential (primary) hypertension: Secondary | ICD-10-CM | POA: Insufficient documentation

## 2023-12-26 DIAGNOSIS — R9389 Abnormal findings on diagnostic imaging of other specified body structures: Secondary | ICD-10-CM | POA: Diagnosis not present

## 2023-12-26 DIAGNOSIS — S0101XA Laceration without foreign body of scalp, initial encounter: Secondary | ICD-10-CM | POA: Insufficient documentation

## 2023-12-26 DIAGNOSIS — S3993XA Unspecified injury of pelvis, initial encounter: Secondary | ICD-10-CM | POA: Diagnosis not present

## 2023-12-26 DIAGNOSIS — S0990XA Unspecified injury of head, initial encounter: Secondary | ICD-10-CM | POA: Diagnosis not present

## 2023-12-26 DIAGNOSIS — J9811 Atelectasis: Secondary | ICD-10-CM | POA: Diagnosis not present

## 2023-12-26 DIAGNOSIS — S199XXA Unspecified injury of neck, initial encounter: Secondary | ICD-10-CM | POA: Diagnosis not present

## 2023-12-26 DIAGNOSIS — Z79899 Other long term (current) drug therapy: Secondary | ICD-10-CM | POA: Diagnosis not present

## 2023-12-26 DIAGNOSIS — S0003XA Contusion of scalp, initial encounter: Secondary | ICD-10-CM | POA: Diagnosis not present

## 2023-12-26 MED ORDER — TETANUS-DIPHTH-ACELL PERTUSSIS 5-2.5-18.5 LF-MCG/0.5 IM SUSY
0.5000 mL | PREFILLED_SYRINGE | Freq: Once | INTRAMUSCULAR | Status: AC
  Administered 2023-12-26: 0.5 mL via INTRAMUSCULAR
  Filled 2023-12-26: qty 0.5

## 2023-12-26 MED ORDER — LIDOCAINE-EPINEPHRINE-TETRACAINE (LET) TOPICAL GEL
3.0000 mL | Freq: Once | TOPICAL | Status: AC
  Administered 2023-12-26: 3 mL via TOPICAL
  Filled 2023-12-26: qty 3

## 2023-12-26 MED ORDER — ACETAMINOPHEN 325 MG PO TABS
650.0000 mg | ORAL_TABLET | Freq: Once | ORAL | Status: AC
  Administered 2023-12-26: 650 mg via ORAL
  Filled 2023-12-26: qty 2

## 2023-12-26 NOTE — ED Notes (Signed)
 Pt has been to CT.

## 2023-12-26 NOTE — Discharge Instructions (Signed)
 Staples can removed in 7 days.  If area becomes increasingly red, swollen, painful, or begins draining pus, return to the emergency department.  Take Tylenol  and/or ibuprofen  as needed for pain and soreness.

## 2023-12-26 NOTE — ED Notes (Signed)
 Patient Alert and oriented to baseline. Stable and ambulatory to baseline. Patient verbalized understanding of the discharge instructions.  Patient belongings were taken by the patient.

## 2023-12-26 NOTE — ED Triage Notes (Signed)
 Pt to the ED with complaints of a fall off a six foot ladder and a head laceration to the posterior head.  Pt denies pain.

## 2023-12-26 NOTE — ED Provider Notes (Signed)
 [place] EMERGENCY DEPARTMENT AT [facility] Provider Note   CSN: [id number] Arrival date & time: 12/26/23  1305     Patient presents with: Leonard Rivera is a 43 y.o. male.    Fall Associated symptoms include headaches.  Patient presents after fall.  Medical history includes HTN, gout, DM, HLD.  This morning, patient was in his normal state of health.  Shortly prior to arrival, he was cleaning some windows at home.  He was using a ladder.  He estimates that he was on the third rung of the ladder when the ladder gave out and he fell.  As he fell, he struck his left posterior head against the wall.  He denies loss of consciousness.  He denies any areas of pain other than his head.  Patient is not on any blood thinners.     Prior to Admission medications   Medication Sig Start Date End Date Taking? Authorizing Provider  allopurinol  (ZYLOPRIM ) 100 MG tablet Take 1 tablet (100 mg total) by mouth daily. 08/10/23   Dettinger, Fonda LABOR, MD  amLODipine  (NORVASC ) 10 MG tablet Take 1 tablet (10 mg total) by mouth daily. 08/10/23   Dettinger, Fonda LABOR, MD  atorvastatin  (LIPITOR) 40 MG tablet Take 1 tablet (40 mg total) by mouth at bedtime. 08/10/23   Dettinger, Fonda LABOR, MD  cetirizine (ZYRTEC) 10 MG tablet Take 10 mg by mouth daily.    [provider]  cyclobenzaprine  (FLEXERIL ) 10 MG tablet Take 1 tablet (10 mg total) by mouth 3 (three) times daily. 12/02/22   Dettinger, Fonda LABOR, MD  Semaglutide , 1 MG/DOSE, 4 MG/3ML SOPN Inject 1 mg as directed once a week. 11/11/23   Dettinger, Fonda LABOR, MD    Allergies: Patient has no known allergies.    Review of Systems  Skin:  Positive for wound.  Neurological:  Positive for headaches.  All other systems reviewed and are negative.   Updated Vital Signs BP 106/64   Pulse 78   Temp 98 F (36.7 C) (Oral)   Resp 16   Ht 5' 8 (1.727 m)   Wt (!) 141.5 kg   SpO2 96%   BMI 47.44 kg/m   Physical Exam Vitals and nursing note  reviewed.  Constitutional:      General: He is not in acute distress.    Appearance: Normal appearance. He is well-developed. He is not ill-appearing, toxic-appearing or diaphoretic.  HENT:     Head: Normocephalic.     Comments: 1 cm laceration to left parietal scalp.  Hemostatic.    Nose: Nose normal.  Eyes:     Extraocular Movements: Extraocular movements intact.     Conjunctiva/sclera: Conjunctivae normal.  Cardiovascular:     Rate and Rhythm: Normal rate and regular rhythm.  Pulmonary:     Effort: Pulmonary effort is normal. No respiratory distress.     Breath sounds: No wheezing or rales.  Chest:     Chest wall: No tenderness.  Abdominal:     General: There is no distension.     Palpations: Abdomen is soft.     Tenderness: There is no abdominal tenderness.  Musculoskeletal:        General: No swelling or deformity. Normal range of motion.     Cervical back: Normal range of motion and neck supple.     Right lower leg: No edema.     Left lower leg: No edema.  Skin:    General: Skin is warm and  dry.     Coloration: Skin is not jaundiced or pale.  Neurological:     General: No focal deficit present.     Mental Status: He is alert and oriented to person, place, and time.     Cranial Nerves: No cranial nerve deficit.     Sensory: No sensory deficit.     Motor: No weakness.     Coordination: Coordination normal.  Psychiatric:        Mood and Affect: Mood normal.        Behavior: Behavior normal.     (all labs ordered are listed, but only abnormal results are displayed) Labs Reviewed - No data to display  EKG: EKG Interpretation Date/Time:  Monday December 26 2023 13:28:53 EDT Ventricular Rate:  83 PR Interval:  156 QRS Duration:  88 QT Interval:  372 QTC Calculation: 438 R Axis:   -39  Text Interpretation: Sinus rhythm Left axis deviation Confirmed by Melvenia Motto [phone] on 12/26/2023 1:56:21 PM  Radiology: CT HEAD WO CONTRAST Result Date: 12/26/2023 CLINICAL  DATA:  Blunt polytrauma, fell off a 6 foot ladder. Head laceration to posterior head. EXAM: CT HEAD WITHOUT CONTRAST CT CERVICAL SPINE WITHOUT CONTRAST TECHNIQUE: Multidetector CT imaging of the head and cervical spine was performed following the standard protocol without intravenous contrast. Multiplanar CT image reconstructions of the cervical spine were also generated. RADIATION DOSE REDUCTION: This exam was performed according to the departmental dose-optimization program which includes automated exposure control, adjustment of the mA and/or kV according to patient size and/or use of iterative reconstruction technique. COMPARISON:  CT head and cervical spine 10/25/2018 FINDINGS: CT HEAD FINDINGS Brain: No intracranial hemorrhage, mass effect, or evidence of acute infarct. No hydrocephalus. No extra-axial fluid collection. Vascular: No hyperdense vessel or unexpected calcification. Skull: No fracture or focal lesion.  Left posterior scalp hematoma Sinuses/Orbits: No acute finding. Paranasal sinuses and mastoid air cells are well aerated. Other: None. CT CERVICAL SPINE FINDINGS Alignment: No evidence of traumatic malalignment. Skull base and vertebrae: No acute fracture. No primary bone lesion or focal pathologic process. Soft tissues and spinal canal: No prevertebral fluid or swelling. No visible canal hematoma. Disc levels: Multilevel spondylosis with large bulky anterior osteophytes from C4-C7. Calcification posterior longitudinal ligament at C4. No severe spinal canal narrowing. Upper chest: Negative. Other: None. IMPRESSION: 1. No acute intracranial abnormality. 2. Left posterior scalp hematoma. No calvarial fracture. 3. No acute fracture in the cervical spine. Electronically Signed   By: Norman Gatlin M.D.   On: 12/26/2023 15:04   CT CERVICAL SPINE WO CONTRAST Result Date: 12/26/2023 CLINICAL DATA:  Blunt polytrauma, fell off a 6 foot ladder. Head laceration to posterior head. EXAM: CT HEAD WITHOUT  CONTRAST CT CERVICAL SPINE WITHOUT CONTRAST TECHNIQUE: Multidetector CT imaging of the head and cervical spine was performed following the standard protocol without intravenous contrast. Multiplanar CT image reconstructions of the cervical spine were also generated. RADIATION DOSE REDUCTION: This exam was performed according to the departmental dose-optimization program which includes automated exposure control, adjustment of the mA and/or kV according to patient size and/or use of iterative reconstruction technique. COMPARISON:  CT head and cervical spine 10/25/2018 FINDINGS: CT HEAD FINDINGS Brain: No intracranial hemorrhage, mass effect, or evidence of acute infarct. No hydrocephalus. No extra-axial fluid collection. Vascular: No hyperdense vessel or unexpected calcification. Skull: No fracture or focal lesion.  Left posterior scalp hematoma Sinuses/Orbits: No acute finding. Paranasal sinuses and mastoid air cells are well aerated. Other: None. CT CERVICAL SPINE  FINDINGS Alignment: No evidence of traumatic malalignment. Skull base and vertebrae: No acute fracture. No primary bone lesion or focal pathologic process. Soft tissues and spinal canal: No prevertebral fluid or swelling. No visible canal hematoma. Disc levels: Multilevel spondylosis with large bulky anterior osteophytes from C4-C7. Calcification posterior longitudinal ligament at C4. No severe spinal canal narrowing. Upper chest: Negative. Other: None. IMPRESSION: 1. No acute intracranial abnormality. 2. Left posterior scalp hematoma. No calvarial fracture. 3. No acute fracture in the cervical spine. Electronically Signed   By: Norman Gatlin M.D.   On: 12/26/2023 15:04   DG Pelvis Portable Result Date: 12/26/2023 CLINICAL DATA:  Clemens off a 6 ft ladder. EXAM: PORTABLE PELVIS 1-2 VIEWS COMPARISON:  None Available. FINDINGS: Unremarkable examination with no fracture or dislocation seen. IMPRESSION: Negative. Electronically Signed   By: Elspeth Bathe  M.D.   On: 12/26/2023 14:30   DG Chest Port 1 View Result Date: 12/26/2023 CLINICAL DATA:  Clemens off a 6 ft ladder. EXAM: PORTABLE CHEST 1 VIEW COMPARISON:  None Available. FINDINGS: Elevated right hemidiaphragm and left diaphragmatic eventration. Poor inspiration. Grossly normal sized heart. Minimal linear atelectasis at the right lung base. Thoracic spine degenerative changes. No fracture or pneumothorax seen. IMPRESSION: Poor inspiration with minimal linear atelectasis at the right lung base. Electronically Signed   By: Elspeth Bathe M.D.   On: 12/26/2023 14:29     .Laceration Repair  Date/Time: 12/26/2023 3:15 PM  Performed by: Melvenia Motto, MD Authorized by: Melvenia Motto, MD   Consent:    Consent obtained:  Verbal   Consent given by:  Patient   Risks, benefits, and alternatives were discussed: yes     Risks discussed:  Infection and pain   Alternatives discussed:  No treatment Universal protocol:    Imaging studies available: yes     Patient identity confirmed:  Verbally with patient Anesthesia:    Anesthesia method:  Topical application   Topical anesthetic:  LET Laceration details:    Location:  Scalp   Scalp location:  Occipital   Length (cm):  1   Depth (mm):  5 Pre-procedure details:    Preparation:  Imaging obtained to evaluate for foreign bodies Treatment:    Area cleansed with:  Saline   Amount of cleaning:  Standard   Irrigation solution:  Sterile saline   Irrigation volume:  50cc   Irrigation method:  Syringe   Visualized foreign bodies/material removed: no   Skin repair:    Repair method:  Staples   Number of staples:  2 Approximation:    Approximation:  Close Repair type:    Repair type:  Simple Post-procedure details:    Dressing:  Open (no dressing)   Procedure completion:  Tolerated well, no immediate complications    Medications Ordered in the ED  lidocaine -EPINEPHrine -tetracaine  (LET) topical gel (3 mLs Topical Given 12/26/23 1339)  Tdap (BOOSTRIX )  injection 0.5 mL (0.5 mLs Intramuscular Given 12/26/23 1339)  acetaminophen  (TYLENOL ) tablet 650 mg (650 mg Oral Given 12/26/23 1338)                                    Medical Decision Making Amount and/or Complexity of Data Reviewed Radiology: ordered.  Risk OTC drugs. Prescription drug management.   Patient presenting for fall from ladder.  During fall, he struck the left side of his head against the wall of his home.  On arrival, patient is alert  and oriented.  He is over well-appearing on exam.  He has a approximately 1 cm left parietal-occipital hemostatic laceration.  Let gel was applied.  Imaging studies were ordered.  Imaging did not show any acute findings.  Laceration was cleansed and repaired with 2 staples.  He had a small amount of surrounding macerated skin.  Patient was discharged in stable condition.     Final diagnoses:  Fall, initial encounter  Laceration of scalp, initial encounter    ED Discharge Orders     None          Melvenia Motto, MD 12/26/23 [phone]

## 2024-01-03 DIAGNOSIS — Z4802 Encounter for removal of sutures: Secondary | ICD-10-CM | POA: Diagnosis not present

## 2024-02-06 DIAGNOSIS — R112 Nausea with vomiting, unspecified: Secondary | ICD-10-CM | POA: Diagnosis not present

## 2024-02-06 DIAGNOSIS — R197 Diarrhea, unspecified: Secondary | ICD-10-CM | POA: Diagnosis not present

## 2024-02-06 DIAGNOSIS — K5900 Constipation, unspecified: Secondary | ICD-10-CM | POA: Diagnosis not present

## 2024-02-13 ENCOUNTER — Encounter: Payer: Self-pay | Admitting: Family Medicine

## 2024-02-13 ENCOUNTER — Ambulatory Visit (INDEPENDENT_AMBULATORY_CARE_PROVIDER_SITE_OTHER): Admitting: Family Medicine

## 2024-02-13 VITALS — BP 151/83 | HR 72 | Temp 98.1°F | Ht 68.0 in | Wt 317.0 lb

## 2024-02-13 DIAGNOSIS — Z7985 Long-term (current) use of injectable non-insulin antidiabetic drugs: Secondary | ICD-10-CM | POA: Diagnosis not present

## 2024-02-13 DIAGNOSIS — E785 Hyperlipidemia, unspecified: Secondary | ICD-10-CM

## 2024-02-13 DIAGNOSIS — I152 Hypertension secondary to endocrine disorders: Secondary | ICD-10-CM | POA: Diagnosis not present

## 2024-02-13 DIAGNOSIS — E1159 Type 2 diabetes mellitus with other circulatory complications: Secondary | ICD-10-CM

## 2024-02-13 DIAGNOSIS — E1169 Type 2 diabetes mellitus with other specified complication: Secondary | ICD-10-CM | POA: Diagnosis not present

## 2024-02-13 LAB — BAYER DCA HB A1C WAIVED: HB A1C (BAYER DCA - WAIVED): 6 % — ABNORMAL HIGH (ref 4.8–5.6)

## 2024-02-13 NOTE — Progress Notes (Signed)
 BP (!) 151/83   Pulse 72   Temp 98.1 F (36.7 C) (Temporal)   Ht 5' 8 (1.727 m)   Wt (!) 317 lb (143.8 kg)   SpO2 94%   BMI 48.20 kg/m    Subjective:   Patient ID: Leonard Rivera, male    DOB: 1980/12/16, 43 y.o.   MRN: [id number]  HPI: Leonard Rivera is a 42 y.o. male presenting on 02/13/2024 for Diabetes and Medical Management of Chronic Issues   Discussed the use of AI scribe software for clinical note transcription with the patient, who gave verbal consent to proceed.  History of Present Illness   Leonard Rivera is a 43 year old male with hypertension and diabetes who presents for a recheck of his blood pressure and diabetes management.  Hypertension - Forgot to take blood pressure medication this morning, which he usually takes daily - Home blood pressure readings typically run low, but exact values are unavailable as his grandmother keeps track of them - No new symptoms related to blood pressure  Diabetes mellitus type 2 - Manages diabetes with Ozempic , a weekly injection - Blood glucose levels have been stable - No issues with Ozempic  administration or side effects  Hyperlipidemia - Continues to take atorvastatin  for cholesterol management  Constitutional symptoms - No new symptoms reported          Relevant past medical, surgical, family and social history reviewed and updated as indicated. Interim medical history since our last visit reviewed. Allergies and medications reviewed and updated.  Review of Systems  Constitutional:  Negative for chills and fever.  Eyes:  Negative for visual disturbance.  Respiratory:  Negative for shortness of breath and wheezing.   Cardiovascular:  Negative for chest pain and leg swelling.  Skin:  Negative for rash.  Neurological:  Negative for dizziness and light-headedness.  All other systems reviewed and are negative.   Per HPI unless specifically indicated above   Allergies as of 02/13/2024   No Known  Allergies      Medication List        Accurate as of February 13, 2024  1:25 PM. If you have any questions, ask your nurse or doctor.          allopurinol  100 MG tablet Commonly known as: ZYLOPRIM  Take 1 tablet (100 mg total) by mouth daily.   amLODipine  10 MG tablet Commonly known as: NORVASC  Take 1 tablet (10 mg total) by mouth daily.   atorvastatin  40 MG tablet Commonly known as: LIPITOR Take 1 tablet (40 mg total) by mouth at bedtime.   cetirizine 10 MG tablet Commonly known as: ZYRTEC Take 10 mg by mouth daily.   cyclobenzaprine  10 MG tablet Commonly known as: FLEXERIL  Take 1 tablet (10 mg total) by mouth 3 (three) times daily.   Semaglutide  (1 MG/DOSE) 4 MG/3ML Sopn Inject 1 mg as directed once a week.         Objective:   BP (!) 151/83   Pulse 72   Temp 98.1 F (36.7 C) (Temporal)   Ht 5' 8 (1.727 m)   Wt (!) 317 lb (143.8 kg)   SpO2 94%   BMI 48.20 kg/m   Wt Readings from Last 3 Encounters:  02/13/24 (!) 317 lb (143.8 kg)  12/26/23 (!) 312 lb (141.5 kg)  11/11/23 (!) 315 lb (142.9 kg)    Physical Exam Physical Exam   VITALS: BP- 151/83 CHEST: Lungs clear to auscultation bilaterally. CARDIOVASCULAR: Heart regular rate and rhythm.  ABDOMEN: Abdomen soft, non-tender, no masses. EXTREMITIES: No edema, good peripheral pulses bilaterally.       Results for orders placed or performed in visit on 11/17/23  [facility] DIABETES EYE EXAM   Collection Time: 11/11/23  4:09 PM  Result Value Ref Range   [facility] Diabetic Eye Exam No Retinopathy No Retinopathy  [facility] DIABETES EYE EXAM   Collection Time: 11/11/23  4:11 PM  Result Value Ref Range   [facility] Diabetic Eye Exam No Retinopathy No Retinopathy    Assessment & Plan:   Problem List Items Addressed This Visit       Cardiovascular and Mediastinum   Hypertension associated with diabetes (HCC) - Primary   Relevant Orders   CBC with Differential/Platelet   CMP14+EGFR   Lipid panel   PSA, total and free    TSH     Endocrine   Type 2 diabetes mellitus with other specified complication (HCC)   Relevant Orders   Bayer DCA Hb A1c Waived   Hyperlipidemia associated with type 2 diabetes mellitus (HCC)   Relevant Orders   CBC with Differential/Platelet   CMP14+EGFR   Lipid panel   PSA, total and free   TSH       Hypertension Blood pressure elevated at 151/83 mmHg, likely due to missed medication. Target is in the 130s mmHg. - Monitor blood pressure at home. - Report if blood pressure is in the 140s or 150s mmHg. - Ensure daily intake of antihypertensive medication.  Type 2 diabetes mellitus associated with hypertension and hyperlipidemia Blood sugar levels well-controlled with weekly Ozempic  injections. - A1c is 6.0 today.  Looks good  Hyperlipidemia Managed with atorvastatin  without issues.  General Health Maintenance Declined flu vaccination. - Offer flu vaccination if he changes his mind.          Follow up plan: Return in about 3 months (around 05/15/2024), or if symptoms worsen or fail to improve, for Diabetes recheck.  Counseling provided for all of the vaccine components Orders Placed This Encounter  Procedures   CBC with Differential/Platelet   CMP14+EGFR   Lipid panel   PSA, total and free   TSH   Bayer DCA Hb A1c Waived    Fonda Levins, MD [facility] 02/13/2024, 1:25 PM

## 2024-02-15 LAB — CBC WITH DIFFERENTIAL/PLATELET
Basophils Absolute: 0.1 x10E3/uL (ref 0.0–0.2)
Basos: 1 %
EOS (ABSOLUTE): 0.5 x10E3/uL — ABNORMAL HIGH (ref 0.0–0.4)
Eos: 6 %
Hematocrit: 45 % (ref 37.5–51.0)
Hemoglobin: 14.9 g/dL (ref 13.0–17.7)
Immature Grans (Abs): 0 x10E3/uL (ref 0.0–0.1)
Immature Granulocytes: 0 %
Lymphocytes Absolute: 2.5 x10E3/uL (ref 0.7–3.1)
Lymphs: 27 %
MCH: 29.4 pg (ref 26.6–33.0)
MCHC: 33.1 g/dL (ref 31.5–35.7)
MCV: 89 fL (ref 79–97)
Monocytes Absolute: 0.5 x10E3/uL (ref 0.1–0.9)
Monocytes: 5 %
Neutrophils Absolute: 5.6 x10E3/uL (ref 1.4–7.0)
Neutrophils: 61 %
Platelets: 281 x10E3/uL (ref 150–450)
RBC: 5.07 x10E6/uL (ref 4.14–5.80)
RDW: 13.5 % (ref 11.6–15.4)
WBC: 9.3 x10E3/uL (ref 3.4–10.8)

## 2024-02-15 LAB — CMP14+EGFR
ALT: 30 IU/L (ref 0–44)
AST: 23 IU/L (ref 0–40)
Albumin: 3.7 g/dL — ABNORMAL LOW (ref 4.1–5.1)
Alkaline Phosphatase: 111 IU/L (ref 47–123)
BUN/Creatinine Ratio: 7 — ABNORMAL LOW (ref 9–20)
BUN: 7 mg/dL (ref 6–24)
Bilirubin Total: 0.2 mg/dL (ref 0.0–1.2)
CO2: 24 mmol/L (ref 20–29)
Calcium: 8.8 mg/dL (ref 8.7–10.2)
Chloride: 100 mmol/L (ref 96–106)
Creatinine, Ser: 1.02 mg/dL (ref 0.76–1.27)
Globulin, Total: 2.4 g/dL (ref 1.5–4.5)
Glucose: 145 mg/dL — ABNORMAL HIGH (ref 70–99)
Potassium: 4 mmol/L (ref 3.5–5.2)
Sodium: 142 mmol/L (ref 134–144)
Total Protein: 6.1 g/dL (ref 6.0–8.5)
eGFR: 94 mL/min/1.73 (ref >59)

## 2024-02-15 LAB — LIPID PANEL
Chol/HDL Ratio: 3.8 ratio (ref 0.0–5.0)
Cholesterol, Total: 153 mg/dL (ref 100–199)
HDL: 40 mg/dL (ref >39)
LDL Chol Calc (NIH): 93 mg/dL (ref 0–99)
Triglycerides: 109 mg/dL (ref 0–149)
VLDL Cholesterol Cal: 20 mg/dL (ref 5–40)

## 2024-02-15 LAB — PSA, TOTAL AND FREE
PSA, Free Pct: 20 %
PSA, Free: 0.12 ng/mL
Prostate Specific Ag, Serum: 0.6 ng/mL (ref 0.0–4.0)

## 2024-02-15 LAB — TSH: TSH: 0.764 u[IU]/mL (ref 0.450–4.500)

## 2024-02-20 ENCOUNTER — Ambulatory Visit: Payer: Self-pay | Admitting: Family Medicine

## 2024-03-22 ENCOUNTER — Ambulatory Visit: Payer: 59

## 2024-03-22 VITALS — BP 151/83 | HR 72 | Ht 68.0 in | Wt 317.0 lb

## 2024-03-22 DIAGNOSIS — Z0000 Encounter for general adult medical examination without abnormal findings: Secondary | ICD-10-CM | POA: Diagnosis not present

## 2024-03-22 NOTE — Progress Notes (Signed)
 Chief Complaint  Patient presents with   Medicare Wellness     Subjective:   Leonard Rivera is a 43 y.o. male who presents for a Medicare Annual Wellness Visit.  Allergies (verified) Patient has no known allergies.   History: Past Medical History:  Diagnosis Date   Gout    Hypertension    Morbid obesity (HCC)    History reviewed. No pertinent surgical history. Family History  Problem Relation Age of Onset   Asthma Brother    Social History   Occupational History   Occupation: Disabled  Tobacco Use   Smoking status: Former    Types: Cigars    Quit date: 10/31/2021    Years since quitting: 2.3   Smokeless tobacco: Never   Tobacco comments:    Started smoking cigars intermittently at age 63 - quit for good 10/2021 (no cigarettes)   Vaping Use   Vaping status: Never Used  Substance and Sexual Activity   Alcohol use: No   Drug use: No   Sexual activity: Yes   Tobacco Counseling Counseling given: Yes Tobacco comments: Started smoking cigars intermittently at age 73 - quit for good 10/2021 (no cigarettes)   SDOH Screenings   Food Insecurity: No Food Insecurity (03/22/2024)  Housing: Unknown (03/22/2024)  Transportation Needs: No Transportation Needs (03/22/2024)  Utilities: Not At Risk (03/22/2024)  Alcohol Screen: Low Risk  (03/22/2023)  Depression (PHQ2-9): Low Risk  (03/22/2024)  Financial Resource Strain: Low Risk  (03/22/2023)  Physical Activity: Sufficiently Active (03/22/2024)  Social Connections: Socially Isolated (03/22/2024)  Stress: No Stress Concern Present (03/22/2024)  Tobacco Use: Medium Risk (03/22/2024)  Health Literacy: Adequate Health Literacy (03/22/2024)   See flowsheets for full screening details  Depression Screen PHQ 2 & 9 Depression Scale- Over the past 2 weeks, how often have you been bothered by any of the following problems? Little interest or pleasure in doing things: 0 Feeling down, depressed, or hopeless (PHQ Adolescent also  includes...irritable): 0 PHQ-2 Total Score: 0 Trouble falling or staying asleep, or sleeping too much: 0 Feeling tired or having little energy: 0 Poor appetite or overeating (PHQ Adolescent also includes...weight loss): 0 Feeling bad about yourself - or that you are a failure or have let yourself or your family down: 0 Trouble concentrating on things, such as reading the newspaper or watching television (PHQ Adolescent also includes...like school work): 0 Moving or speaking so slowly that other people could have noticed. Or the opposite - being so fidgety or restless that you have been moving around a lot more than usual: 0 Thoughts that you would be better off dead, or of hurting yourself in some way: 0 PHQ-9 Total Score: 0 If you checked off any problems, how difficult have these problems made it for you to do your work, take care of things at home, or get along with other people?: Not difficult at all     Goals Addressed             This Visit's Progress    DIET - EAT MORE FRUITS AND VEGETABLES   On track      Visit info / Clinical Intake: Medicare Wellness Visit Type:: Subsequent Annual Wellness Visit Persons participating in visit:: patient Medicare Wellness Visit Mode:: Telephone If telephone:: video declined Because this visit was a virtual/telehealth visit:: vitals recorded from last visit If Telephone or Video please confirm:: I connected with the patient using audio enabled telemedicine application and verified that I am speaking with the correct person  using two identifiers Patient Location:: home Provider Location:: home office Information given by:: patient Interpreter Needed?: No Pre-visit prep was completed: yes AWV questionnaire completed by patient prior to visit?: no Living arrangements:: with family/others Patient's Overall Health Status Rating: very good Typical amount of pain: none Does pain affect daily life?: no Are you currently prescribed opioids?:  no  Dietary Habits and Nutritional Risks How many meals a day?: 3 Eats fruit and vegetables daily?: yes Most meals are obtained by: preparing own meals In the last 2 weeks, have you had any of the following?: none Diabetic:: no  Functional Status Activities of Daily Living (to include ambulation/medication): Independent Ambulation: Independent Medication Administration: Independent Home Management: Independent Manage your own finances?: yes Primary transportation is: driving Concerns about hearing?: no  Fall Screening Falls in the past year?: 1 Number of falls in past year: 0 Was there an injury with Fall?: 1 Fall Risk Category Calculator: 2 Patient Fall Risk Level: Moderate Fall Risk  Fall Risk Patient at Risk for Falls Due to: Impaired balance/gait; Impaired mobility Fall risk Follow up: Falls evaluation completed; Education provided  Home and Transportation Safety: All rugs have non-skid backing?: yes All stairs or steps have railings?: (!) no Grab bars in the bathtub or shower?: yes Have non-skid surface in bathtub or shower?: yes Good home lighting?: yes Regular seat belt use?: yes Hospital stays in the last year:: no  Cognitive Assessment Difficulty concentrating, remembering, or making decisions? : no Will 6CIT or Mini Cog be Completed: yes What year is it?: 0 points What month is it?: 0 points Give patient an address phrase to remember (5 components): [address] [place] About what time is it?: 0 points Count backwards from 20 to 1: 0 points Repeat the address phrase from earlier: 0 points  Advance Directives (For Healthcare) Does Patient Have a Medical Advance Directive?: No Would patient like information on creating a medical advance directive?: No - Patient declined  Reviewed/Updated  Reviewed/Updated: Reviewed All (Medical, Surgical, Family, Medications, Allergies, Care Teams, Patient Goals); Medical History; Surgical History; Family History;  Medications; Allergies; Care Teams; Patient Goals        Objective:    Today's Vitals   03/22/24 1112  BP: (!) 151/83  Pulse: 72  Weight: (!) 317 lb (143.8 kg)  Height: 5' 8 (1.727 m)   Body mass index is 48.2 kg/m.  Current Medications (verified) Outpatient Encounter Medications as of 03/22/2024  Medication Sig   allopurinol  (ZYLOPRIM ) 100 MG tablet Take 1 tablet (100 mg total) by mouth daily.   amLODipine  (NORVASC ) 10 MG tablet Take 1 tablet (10 mg total) by mouth daily.   atorvastatin  (LIPITOR) 40 MG tablet Take 1 tablet (40 mg total) by mouth at bedtime.   cetirizine (ZYRTEC) 10 MG tablet Take 10 mg by mouth daily.   cyclobenzaprine  (FLEXERIL ) 10 MG tablet Take 1 tablet (10 mg total) by mouth 3 (three) times daily.   Semaglutide , 1 MG/DOSE, 4 MG/3ML SOPN Inject 1 mg as directed once a week.   No facility-administered encounter medications on file as of 03/22/2024.   Hearing/Vision screen Hearing Screening - Comments:: Pt denies hearing dif Vision Screening - Comments:: Pt denies vision dif cherre ov 72yrs/ Immunizations and Health Maintenance Health Maintenance  Topic Date Due   COVID-19 Vaccine (3 - 2025-26 season) 01/02/2024   Influenza Vaccine  07/31/2024 (Originally 12/02/2023)   HPV VACCINES (1 - 3-dose SCDM series) 11/10/2024 (Originally 05/11/2008)   HEMOGLOBIN A1C  08/13/2024  Diabetic kidney evaluation - Urine ACR  10/17/2024   FOOT EXAM  11/10/2024   OPHTHALMOLOGY EXAM  11/10/2024   Diabetic kidney evaluation - eGFR measurement  02/12/2025   Medicare Annual Wellness (AWV)  03/22/2025   DTaP/Tdap/Td (6 - Td or Tdap) 12/25/2033   Hepatitis C Screening  Completed   HIV Screening  Completed   Meningococcal B Vaccine  Aged Out   Pneumococcal Vaccine  Discontinued   Hepatitis B Vaccines 19-59 Average Risk  Discontinued        Assessment/Plan:  This is a routine wellness examination for Leonard Rivera.  Patient Care Team: Dettinger, Fonda LABOR, MD as PCP - General  (Family Medicine) Gladis Gearing, OD (Optometry) Kristine Ronal BIRCH, RD as Dietitian (Nutrition)  I have personally reviewed and noted the following in the patient's chart:   Medical and social history Use of alcohol, tobacco or illicit drugs  Current medications and supplements including opioid prescriptions. Functional ability and status Nutritional status Physical activity Advanced directives List of other physicians Hospitalizations, surgeries, and ER visits in previous 12 months Vitals Screenings to include cognitive, depression, and falls Referrals and appointments  No orders of the defined types were placed in this encounter.  In addition, I have reviewed and discussed with patient certain preventive protocols, quality metrics, and best practice recommendations. A written personalized care plan for preventive services as well as general preventive health recommendations were provided to patient.   Ozie Ned, CMA   03/22/2024   Return in 1 year (on 03/22/2025).  After Visit Summary: (MyChart) Due to this being a telephonic visit, the after visit summary with patients personalized plan was offered to patient via MyChart   Nurse Notes: n/a

## 2024-04-22 ENCOUNTER — Other Ambulatory Visit: Payer: Self-pay | Admitting: Family Medicine

## 2024-05-16 ENCOUNTER — Encounter: Payer: Self-pay | Admitting: Family Medicine

## 2024-05-16 ENCOUNTER — Ambulatory Visit: Payer: Self-pay | Admitting: Family Medicine

## 2024-05-16 VITALS — BP 155/90 | HR 77 | Ht 68.0 in | Wt 313.0 lb

## 2024-05-16 DIAGNOSIS — I152 Hypertension secondary to endocrine disorders: Secondary | ICD-10-CM | POA: Diagnosis not present

## 2024-05-16 DIAGNOSIS — E7800 Pure hypercholesterolemia, unspecified: Secondary | ICD-10-CM

## 2024-05-16 DIAGNOSIS — E785 Hyperlipidemia, unspecified: Secondary | ICD-10-CM

## 2024-05-16 DIAGNOSIS — E1169 Type 2 diabetes mellitus with other specified complication: Secondary | ICD-10-CM | POA: Diagnosis not present

## 2024-05-16 DIAGNOSIS — E1159 Type 2 diabetes mellitus with other circulatory complications: Secondary | ICD-10-CM | POA: Diagnosis not present

## 2024-05-16 DIAGNOSIS — Z6841 Body Mass Index (BMI) 40.0 and over, adult: Secondary | ICD-10-CM

## 2024-05-16 DIAGNOSIS — Z7985 Long-term (current) use of injectable non-insulin antidiabetic drugs: Secondary | ICD-10-CM

## 2024-05-16 DIAGNOSIS — I10 Essential (primary) hypertension: Secondary | ICD-10-CM | POA: Diagnosis not present

## 2024-05-16 LAB — BAYER DCA HB A1C WAIVED: HB A1C (BAYER DCA - WAIVED): 6.6 % — ABNORMAL HIGH (ref 4.8–5.6)

## 2024-05-16 MED ORDER — ALLOPURINOL 100 MG PO TABS: 100.0000 mg | ORAL_TABLET | Freq: Every day | ORAL | 3 refills | Status: AC

## 2024-05-16 MED ORDER — OZEMPIC (1 MG/DOSE) 4 MG/3ML ~~LOC~~ SOPN: 1.0000 mg | PEN_INJECTOR | SUBCUTANEOUS | 3 refills | Status: AC

## 2024-05-16 MED ORDER — AMLODIPINE BESYLATE 10 MG PO TABS: 10.0000 mg | ORAL_TABLET | Freq: Every day | ORAL | 3 refills | Status: AC

## 2024-05-16 MED ORDER — ATORVASTATIN CALCIUM 40 MG PO TABS: 40.0000 mg | ORAL_TABLET | Freq: Every day | ORAL | 3 refills | Status: AC

## 2024-05-16 NOTE — Progress Notes (Signed)
 "  BP (!) 155/90   Pulse 77   Ht 5' 8 (1.727 m)   Wt (!) 313 lb (142 kg)   SpO2 96%   BMI 47.59 kg/m    Subjective:   Patient ID: Leonard Rivera, male    DOB: 11-29-1980, 44 y.o.   MRN: [id number]  HPI: Leonard Rivera is a 44 y.o. male presenting on 05/16/2024 for Medical Management of Chronic Issues, Diabetes, and Hypertension   Discussed the use of AI scribe software for clinical note transcription with the patient, who gave verbal consent to proceed.  History of Present Illness   Leonard Rivera is a 44 year old male with hypertension and diabetes who presents for a follow-up visit.  Glycemic control - Blood glucose levels have been stable - Currently taking Ozempic  without adverse effects - Describes overall health as 'doing good'  Blood pressure monitoring - Home blood pressure readings have been lower - Recent blood pressure reading of 145 mmHg last week - Monitors blood pressure at home, especially in the mornings  Lipid management - Takes cholesterol medication daily          Relevant past medical, surgical, family and social history reviewed and updated as indicated. Interim medical history since our last visit reviewed. Allergies and medications reviewed and updated.  Review of Systems  Constitutional:  Negative for chills and fever.  Eyes:  Negative for visual disturbance.  Respiratory:  Negative for shortness of breath and wheezing.   Cardiovascular:  Negative for chest pain and leg swelling.  Musculoskeletal:  Negative for back pain and gait problem.  Skin:  Negative for rash.  Neurological:  Negative for dizziness and light-headedness.  All other systems reviewed and are negative.   Per HPI unless specifically indicated above   Allergies as of 05/16/2024   No Known Allergies      Medication List        Accurate as of May 16, 2024  2:33 PM. If you have any questions, ask your nurse or doctor.          allopurinol  100 MG  tablet Commonly known as: ZYLOPRIM  Take 1 tablet (100 mg total) by mouth daily.   amLODipine  10 MG tablet Commonly known as: NORVASC  Take 1 tablet (10 mg total) by mouth daily.   atorvastatin  40 MG tablet Commonly known as: LIPITOR Take 1 tablet (40 mg total) by mouth at bedtime.   cetirizine 10 MG tablet Commonly known as: ZYRTEC Take 10 mg by mouth daily.   cyclobenzaprine  10 MG tablet Commonly known as: FLEXERIL  Take 1 tablet (10 mg total) by mouth 3 (three) times daily.   Ozempic  (1 MG/DOSE) 4 MG/3ML Sopn Generic drug: Semaglutide  (1 MG/DOSE) Inject 1 mg into the skin once a week. What changed: See the new instructions. Changed by: Fonda Levins, MD         Objective:   BP (!) 155/90   Pulse 77   Ht 5' 8 (1.727 m)   Wt (!) 313 lb (142 kg)   SpO2 96%   BMI 47.59 kg/m   Wt Readings from Last 3 Encounters:  05/16/24 (!) 313 lb (142 kg)  03/22/24 (!) 317 lb (143.8 kg)  02/13/24 (!) 317 lb (143.8 kg)    Physical Exam Physical Exam   VITALS: BP- 155/90 CHEST: Lungs clear to auscultation bilaterally. CARDIOVASCULAR: Heart regular rate and rhythm, no murmurs. EXTREMITIES: No edema in lower extremities.         Assessment & Plan:  Problem List Items Addressed This Visit       Cardiovascular and Mediastinum   Hypertension associated with diabetes (HCC)   Relevant Medications   amLODipine  (NORVASC ) 10 MG tablet   atorvastatin  (LIPITOR) 40 MG tablet   Semaglutide , 1 MG/DOSE, (OZEMPIC , 1 MG/DOSE,) 4 MG/3ML SOPN     Endocrine   Type 2 diabetes mellitus with other specified complication (HCC) - Primary   Relevant Medications   atorvastatin  (LIPITOR) 40 MG tablet   Semaglutide , 1 MG/DOSE, (OZEMPIC , 1 MG/DOSE,) 4 MG/3ML SOPN   Other Relevant Orders   Bayer DCA Hb A1c Waived   Hyperlipidemia associated with type 2 diabetes mellitus (HCC)   Relevant Medications   amLODipine  (NORVASC ) 10 MG tablet   atorvastatin  (LIPITOR) 40 MG tablet   Semaglutide ,  1 MG/DOSE, (OZEMPIC , 1 MG/DOSE,) 4 MG/3ML SOPN     Other   Morbid obesity (HCC)   Relevant Medications   Semaglutide , 1 MG/DOSE, (OZEMPIC , 1 MG/DOSE,) 4 MG/3ML SOPN   Other Visit Diagnoses       Essential hypertension       Relevant Medications   amLODipine  (NORVASC ) 10 MG tablet   atorvastatin  (LIPITOR) 40 MG tablet     Pure hypercholesterolemia       Relevant Medications   amLODipine  (NORVASC ) 10 MG tablet   atorvastatin  (LIPITOR) 40 MG tablet          Type 2 diabetes mellitus with other specified complication Type 2 diabetes mellitus is well-managed with Ozempic . - Continue Ozempic  as prescribed.  Essential hypertension Blood pressure elevated in office, home readings suggest white coat hypertension. - Continue to monitor blood pressure at home regularly.  Pure hypercholesterolemia Hypercholesterolemia managed with daily medication. - Continue current cholesterol medication.      A1c 6.6, still controlled, no changes    Follow up plan: Return in about 3 months (around 08/14/2024), or if symptoms worsen or fail to improve, for Diabetes recheck.  Counseling provided for all of the vaccine components Orders Placed This Encounter  Procedures   Bayer DCA Hb A1c Waived    Fonda Levins, MD [facility] 05/16/2024, 2:33 PM     "

## 2024-08-15 ENCOUNTER — Ambulatory Visit: Admitting: Family Medicine
